# Patient Record
Sex: Male | Born: 1955 | Race: White | Hispanic: No | Smoking: Former smoker
Health system: Southern US, Community
[De-identification: ages and names within clinical notes are randomized; demographics above are authoritative.]

## PROBLEM LIST (undated history)

## (undated) DIAGNOSIS — M1990 Unspecified osteoarthritis, unspecified site: Secondary | ICD-10-CM

## (undated) DIAGNOSIS — K219 Gastro-esophageal reflux disease without esophagitis: Secondary | ICD-10-CM

## (undated) DIAGNOSIS — Z87442 Personal history of urinary calculi: Secondary | ICD-10-CM

## (undated) DIAGNOSIS — C801 Malignant (primary) neoplasm, unspecified: Secondary | ICD-10-CM

## (undated) HISTORY — PX: EXTRACORPOREAL SHOCK WAVE LITHOTRIPSY: SHX1557

---

## 2005-08-12 ENCOUNTER — Ambulatory Visit: Payer: Self-pay | Admitting: Gastroenterology

## 2009-08-04 ENCOUNTER — Emergency Department: Payer: Self-pay | Admitting: Internal Medicine

## 2009-08-05 ENCOUNTER — Emergency Department (HOSPITAL_COMMUNITY): Admission: EM | Admit: 2009-08-05 | Discharge: 2009-08-05 | Payer: Self-pay | Admitting: Family Medicine

## 2009-08-06 ENCOUNTER — Inpatient Hospital Stay (HOSPITAL_COMMUNITY): Admission: EM | Admit: 2009-08-06 | Discharge: 2009-08-13 | Payer: Self-pay | Admitting: Emergency Medicine

## 2009-08-14 ENCOUNTER — Ambulatory Visit: Payer: Self-pay | Admitting: Orthopedic Surgery

## 2009-08-16 ENCOUNTER — Emergency Department (HOSPITAL_COMMUNITY): Admission: EM | Admit: 2009-08-16 | Discharge: 2009-08-17 | Payer: Self-pay | Admitting: Emergency Medicine

## 2009-08-28 ENCOUNTER — Ambulatory Visit: Payer: Self-pay | Admitting: Orthopedic Surgery

## 2010-07-19 ENCOUNTER — Ambulatory Visit: Payer: Self-pay | Admitting: Urology

## 2010-10-07 DIAGNOSIS — C61 Malignant neoplasm of prostate: Secondary | ICD-10-CM

## 2010-10-07 HISTORY — DX: Malignant neoplasm of prostate: C61

## 2010-10-07 HISTORY — PX: INSERTION PROSTATE RADIATION SEED: SUR718

## 2010-12-13 ENCOUNTER — Ambulatory Visit: Payer: Self-pay | Admitting: Urology

## 2011-01-09 LAB — CBC
Hemoglobin: 11.9 g/dL — ABNORMAL LOW (ref 13.0–17.0)
Hemoglobin: 13 g/dL (ref 13.0–17.0)
MCHC: 34.5 g/dL (ref 30.0–36.0)
MCHC: 35.2 g/dL (ref 30.0–36.0)
MCV: 91.8 fL (ref 78.0–100.0)
MCV: 92.2 fL (ref 78.0–100.0)
Platelets: 230 10*3/uL (ref 150–400)
RBC: 4 MIL/uL — ABNORMAL LOW (ref 4.22–5.81)
RBC: 4.04 MIL/uL — ABNORMAL LOW (ref 4.22–5.81)
RDW: 12.3 % (ref 11.5–15.5)
RDW: 12.6 % (ref 11.5–15.5)
RDW: 13.1 % (ref 11.5–15.5)
RDW: 13.1 % (ref 11.5–15.5)
WBC: 10.4 10*3/uL (ref 4.0–10.5)
WBC: 10.8 10*3/uL — ABNORMAL HIGH (ref 4.0–10.5)

## 2011-01-09 LAB — BASIC METABOLIC PANEL
BUN: 10 mg/dL (ref 6–23)
BUN: 6 mg/dL (ref 6–23)
BUN: 8 mg/dL (ref 6–23)
CO2: 25 mEq/L (ref 19–32)
CO2: 25 mEq/L (ref 19–32)
CO2: 27 mEq/L (ref 19–32)
Calcium: 8.2 mg/dL — ABNORMAL LOW (ref 8.4–10.5)
Calcium: 8.3 mg/dL — ABNORMAL LOW (ref 8.4–10.5)
Calcium: 8.7 mg/dL (ref 8.4–10.5)
Calcium: 8.7 mg/dL (ref 8.4–10.5)
Chloride: 104 mEq/L (ref 96–112)
Chloride: 106 mEq/L (ref 96–112)
Chloride: 107 mEq/L (ref 96–112)
Creatinine, Ser: 0.8 mg/dL (ref 0.4–1.5)
GFR calc Af Amer: >60 mL/min (ref >60)
GFR calc Af Amer: >60 mL/min (ref >60)
GFR calc Af Amer: >60 mL/min (ref >60)
GFR calc non Af Amer: >60 mL/min (ref >60)
GFR calc non Af Amer: >60 mL/min (ref >60)
Glucose, Bld: 108 mg/dL — ABNORMAL HIGH (ref 70–99)
Glucose, Bld: 114 mg/dL — ABNORMAL HIGH (ref 70–99)
Potassium: 3.3 mEq/L — ABNORMAL LOW (ref 3.5–5.1)
Sodium: 133 mEq/L — ABNORMAL LOW (ref 135–145)
Sodium: 136 mEq/L (ref 135–145)
Sodium: 140 mEq/L (ref 135–145)
Sodium: 141 mEq/L (ref 135–145)

## 2011-01-09 LAB — ANAEROBIC CULTURE

## 2011-01-09 LAB — POCT I-STAT, CHEM 8
BUN: 18 mg/dL (ref 6–23)
Creatinine, Ser: 0.7 mg/dL (ref 0.4–1.5)
HCT: 38 % — ABNORMAL LOW (ref 39.0–52.0)
Hemoglobin: 12.9 g/dL — ABNORMAL LOW (ref 13.0–17.0)
Sodium: 142 mEq/L (ref 135–145)
TCO2: 27 mmol/L (ref 0–100)

## 2011-01-09 LAB — DIFFERENTIAL
Basophils Relative: 1 % (ref 0–1)
Eosinophils Relative: 5 % (ref 0–5)
Lymphs Abs: 2.1 10*3/uL (ref 0.7–4.0)
Neutro Abs: 7.5 10*3/uL (ref 1.7–7.7)
Neutrophils Relative %: 70 % (ref 43–77)

## 2011-01-09 LAB — HEMOGLOBIN A1C: Mean Plasma Glucose: 111 mg/dL

## 2011-01-09 LAB — BODY FLUID CULTURE

## 2011-01-10 LAB — CBC
HCT: 44 % (ref 39.0–52.0)
Hemoglobin: 13.1 g/dL (ref 13.0–17.0)
MCHC: 34.4 g/dL (ref 30.0–36.0)
MCHC: 34.9 g/dL (ref 30.0–36.0)
MCV: 93.2 fL (ref 78.0–100.0)
RDW: 12.6 % (ref 11.5–15.5)
RDW: 13 % (ref 11.5–15.5)

## 2011-01-10 LAB — CULTURE, BLOOD (ROUTINE X 2)
Culture: NO GROWTH
Culture: NO GROWTH

## 2011-01-10 LAB — URINE MICROSCOPIC-ADD ON

## 2011-01-10 LAB — COMPREHENSIVE METABOLIC PANEL
ALT: 18 U/L (ref 0–53)
Calcium: 8.1 mg/dL — ABNORMAL LOW (ref 8.4–10.5)
Creatinine, Ser: 0.78 mg/dL (ref 0.4–1.5)
GFR calc Af Amer: >60 mL/min (ref >60)
GFR calc non Af Amer: >60 mL/min (ref >60)
Glucose, Bld: 162 mg/dL — ABNORMAL HIGH (ref 70–99)
Sodium: 135 mEq/L (ref 135–145)
Total Protein: 6 g/dL (ref 6.0–8.3)

## 2011-01-10 LAB — DIFFERENTIAL
Eosinophils Absolute: 0 10*3/uL (ref 0.0–0.7)
Eosinophils Relative: 0 % (ref 0–5)
Lymphocytes Relative: 5 % — ABNORMAL LOW (ref 12–46)
Lymphocytes Relative: 7 % — ABNORMAL LOW (ref 12–46)
Lymphs Abs: 0.9 10*3/uL (ref 0.7–4.0)
Monocytes Absolute: 1.2 10*3/uL — ABNORMAL HIGH (ref 0.1–1.0)
Monocytes Relative: 5 % (ref 3–12)
Monocytes Relative: 6 % (ref 3–12)
Neutrophils Relative %: 89 % — ABNORMAL HIGH (ref 43–77)
WBC Morphology: INCREASED

## 2011-01-10 LAB — SEDIMENTATION RATE: Sed Rate: 27 mm/hr — ABNORMAL HIGH (ref 0–16)

## 2011-01-10 LAB — URINALYSIS, ROUTINE W REFLEX MICROSCOPIC
Bilirubin Urine: NEGATIVE
Glucose, UA: >1000 mg/dL — AB
Hgb urine dipstick: NEGATIVE
Ketones, ur: NEGATIVE mg/dL
Protein, ur: NEGATIVE mg/dL

## 2011-01-10 LAB — LACTIC ACID, PLASMA: Lactic Acid, Venous: 1.6 mmol/L (ref 0.5–2.2)

## 2011-01-10 LAB — POCT I-STAT, CHEM 8
Chloride: 104 mEq/L (ref 96–112)
HCT: 46 % (ref 39.0–52.0)
Hemoglobin: 15.6 g/dL (ref 13.0–17.0)
Potassium: 3.4 mEq/L — ABNORMAL LOW (ref 3.5–5.1)

## 2014-08-13 ENCOUNTER — Emergency Department: Payer: Self-pay | Admitting: Internal Medicine

## 2016-02-17 ENCOUNTER — Emergency Department
Admission: EM | Admit: 2016-02-17 | Discharge: 2016-02-17 | Disposition: A | Discharge status: Home / Self Care | Payer: Worker's Compensation | Attending: Emergency Medicine | Admitting: Emergency Medicine

## 2016-02-17 ENCOUNTER — Emergency Department: Payer: Worker's Compensation

## 2016-02-17 ENCOUNTER — Encounter: Payer: Self-pay | Admitting: Emergency Medicine

## 2016-02-17 DIAGNOSIS — W01198A Fall on same level from slipping, tripping and stumbling with subsequent striking against other object, initial encounter: Secondary | ICD-10-CM | POA: Insufficient documentation

## 2016-02-17 DIAGNOSIS — S62102A Fracture of unspecified carpal bone, left wrist, initial encounter for closed fracture: Secondary | ICD-10-CM

## 2016-02-17 DIAGNOSIS — Y9285 Railroad track as the place of occurrence of the external cause: Secondary | ICD-10-CM | POA: Insufficient documentation

## 2016-02-17 DIAGNOSIS — Y9389 Activity, other specified: Secondary | ICD-10-CM | POA: Insufficient documentation

## 2016-02-17 DIAGNOSIS — Y990 Civilian activity done for income or pay: Secondary | ICD-10-CM | POA: Diagnosis not present

## 2016-02-17 DIAGNOSIS — Z87891 Personal history of nicotine dependence: Secondary | ICD-10-CM | POA: Insufficient documentation

## 2016-02-17 DIAGNOSIS — S52592A Other fractures of lower end of left radius, initial encounter for closed fracture: Secondary | ICD-10-CM | POA: Diagnosis not present

## 2016-02-17 DIAGNOSIS — M25532 Pain in left wrist: Secondary | ICD-10-CM | POA: Diagnosis present

## 2016-02-17 MED ORDER — IBUPROFEN 800 MG PO TABS: 800.0000 mg | ORAL_TABLET | Freq: Three times a day (TID) | ORAL | Status: DC | PRN

## 2016-02-17 MED ORDER — TRAMADOL HCL 50 MG PO TABS: 50.0000 mg | ORAL_TABLET | Freq: Four times a day (QID) | ORAL | Status: AC | PRN

## 2016-02-17 MED ORDER — IBUPROFEN 800 MG PO TABS
800.0000 mg | ORAL_TABLET | Freq: Once | ORAL | Status: AC
  Administered 2016-02-17: 800 mg via ORAL
  Filled 2016-02-17: qty 1

## 2016-02-17 MED ORDER — TRAMADOL HCL 50 MG PO TABS
50.0000 mg | ORAL_TABLET | Freq: Once | ORAL | Status: AC
Start: 2016-02-17 — End: 2016-02-17
  Administered 2016-02-17: 50 mg via ORAL
  Filled 2016-02-17: qty 1

## 2016-02-17 NOTE — ED Provider Notes (Signed)
[facility] Emergency Department Provider Note   ____________________________________________  Time seen: Approximately 4:35 PM  I have reviewed the triage vital signs and the nursing notes.   HISTORY  Chief Complaint Wrist Pain    HPI Nathan Holland is a 60 y.o. male chief complaint of left wrist pain secondary to fall. 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 He is rating his pain9/10. Patient describes the pain as "achy". Patient is right-hand dominant. No palliative measures taken prior to arrival.   History reviewed. No pertinent past medical history.  There are no active problems to display for this patient.   History reviewed. No pertinent past surgical history.  Current Outpatient Rx  Name  Route  Sig  Dispense  Refill  . ibuprofen (ADVIL,MOTRIN) 800 MG tablet   Oral   Take 1 tablet (800 mg total) by mouth every 8 (eight) hours as needed.   30 tablet   0   . traMADol (ULTRAM) 50 MG tablet   Oral   Take 1 tablet (50 mg total) by mouth every 6 (six) hours as needed.   20 tablet   0     Allergies Review of patient's allergies indicates no known allergies.  No family history on file.  Social History Social History  Substance Use Topics  . Smoking status: Former Research scientist (life sciences)  . Smokeless tobacco: None  . Alcohol Use: No    Review of Systems Constitutional: No fever/chills Eyes: No visual changes. ENT: No sore throat. Cardiovascular: Denies chest pain. Respiratory: Denies shortness of breath. Gastrointestinal: No abdominal pain.  No nausea, no vomiting.  No diarrhea.  No constipation. Genitourinary: Negative for dysuria. Musculoskeletal: Left wrist pain Skin: Negative for rash. Neurological: Negative for headaches, focal weakness or numbness.    ____________________________________________   PHYSICAL EXAM:  VITAL SIGNS: ED Triage Vitals  Enc Vitals Group   BP 02/17/16 1618 169/85 mmHg     Pulse Rate 02/17/16 1618 57     Resp 02/17/16 1618 18     Temp 02/17/16 1618 97.7 F (36.5 C)     Temp Source 02/17/16 1618 Oral     SpO2 02/17/16 1618 97 %     Weight 02/17/16 1618 275 lb (124.739 kg)     Height 02/17/16 1618 5\' 11"  (1.803 m)     Head Cir --      Peak Flow --      Pain Score 02/17/16 1617 9     Pain Loc --      Pain Edu? --      Excl. in [place]? --     Constitutional: Alert and oriented. Well appearing and in no acute distress. Eyes: Conjunctivae are normal. PERRL. EOMI. Head: Atraumatic. Nose: No congestion/rhinnorhea. Mouth/Throat: Mucous membranes are moist.  Oropharynx non-erythematous. Neck: No stridor.  No cervical spine tenderness to palpation. Hematological/Lymphatic/Immunilogical: No cervical lymphadenopathy. Cardiovascular: Normal rate, regular rhythm. Grossly normal heart sounds.  Good peripheral circulation. Respiratory: Normal respiratory effort.  No retractions. Lungs CTAB. Gastrointestinal: Soft and nontender. No distention. No abdominal bruits. No CVA tenderness. Musculoskeletal: No obvious deformity to the left wrist. Moderate edema. Neurovascular intact. Patient guarding palpation of the distal radius.  Neurologic:  Normal speech and language. No gross focal neurologic deficits are appreciated. No gait instability. Skin:  Skin is warm, dry and intact. No rash noted. Psychiatric: Mood and affect are normal. Speech and behavior are normal.  ____________________________________________  LABS (all labs ordered are listed, but only abnormal results are displayed)  Labs Reviewed - No data to display ____________________________________________  EKG   ____________________________________________  RADIOLOGY  Distal left radial fracture. I, Sable Feil, personally viewed and evaluated these images (plain radiographs) as part of my medical decision making, as well as reviewing the written report by the  radiologist.  ____________________________________________   PROCEDURES  Procedure(s) performed: None  Critical Care performed: No  ____________________________________________   INITIAL IMPRESSION / ASSESSMENT AND PLAN / ED COURSE  Pertinent labs & imaging results that were available during my care of the patient were reviewed by me and considered in my medical decision making (see chart for details).  Wrist fracture. Discussed x-ray finding with patient. Patient placed in a sugar tong splint with sling. Patient given discharge care instructions. Patient advised to follow orthopedics by calling for [facility] in 2 days. Patient given a prescription for ibuprofen and tramadol. ____________________________________________   FINAL CLINICAL IMPRESSION(S) / ED DIAGNOSES  Final diagnoses:  Left wrist fracture, closed, initial encounter      NEW MEDICATIONS STARTED DURING THIS VISIT:  New Prescriptions   IBUPROFEN (ADVIL,MOTRIN) 800 MG TABLET    Take 1 tablet (800 mg total) by mouth every 8 (eight) hours as needed.   TRAMADOL (ULTRAM) 50 MG TABLET    Take 1 tablet (50 mg total) by mouth every 6 (six) hours as needed.     Note:  This document was prepared using Dragon voice recognition software and may include unintentional dictation errors.    Sable Feil, PA-C 02/17/16 1649  Daymon Larsen, MD 02/17/16 [phone]

## 2016-02-17 NOTE — ED Notes (Signed)
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

## 2016-02-17 NOTE — Discharge Instructions (Signed)
Wear Wrist Fracture A wrist fracture is a break or crack in one of the bones of your wrist. Your wrist is made up of eight small bones at the palm of your hand (carpal bones) and two long bones that make up your forearm (radius and ulna). CAUSES  A direct blow to the wrist.  Falling on an outstretched hand.  Trauma, such as a car accident or a fall. RISK FACTORS Risk factors for wrist fracture include:  Participating in contact and high-risk sports, such as skiing, biking, and ice skating.  Taking steroid medicines.  Smoking.  Being male.  Being Caucasian.  Drinking more than three alcoholic beverages per day.  Having low or lowered bone density (osteoporosis or osteopenia).  Age. Older adults have decreased bone density.  Women who have had menopause.  History of previous fractures. SIGNS AND SYMPTOMS Symptoms of wrist fractures include tenderness, bruising, and inflammation. Additionally, the wrist may hang in an odd position or appear deformed. DIAGNOSIS Diagnosis may include:  Physical exam.  X-ray. TREATMENT Treatment depends on many factors, including the nature and location of the fracture, your age, and your activity level. Treatment for wrist fracture can be nonsurgical or surgical. Nonsurgical Treatment A plaster cast or splint may be applied to your wrist if the bone is in a good position. If the fracture is not in good position, it may be necessary for your health care provider to realign it before applying a splint or cast. Usually, a cast or splint will be worn for several weeks. Surgical Treatment Sometimes the position of the bone is so far out of place that surgery is required to apply a device to hold it together as it heals. Depending on the fracture, there are a number of options for holding the bone in place while it heals, such as a cast and metal pins. HOME CARE INSTRUCTIONS  Keep your injured wrist elevated and move your fingers as much as  possible.  Do not put pressure on any part of your cast or splint. It may break.  Use a plastic bag to protect your cast or splint from water while bathing or showering. Do not lower your cast or splint into water.  Take medicines only as directed by your health care provider.  Keep your cast or splint clean and dry. If it becomes wet, damaged, or suddenly feels too tight, contact your health care provider right away.  Do not use any tobacco products including cigarettes, chewing tobacco, or electronic cigarettes. Tobacco can delay bone healing. If you need help quitting, ask your health care provider.  Keep all follow-up visits as directed by your health care provider. This is important.  Ask your health care provider if you should take supplements of calcium and vitamins C and D to promote bone healing. SEEK MEDICAL CARE IF:  Your cast or splint is damaged, breaks, or gets wet.  You have a fever.  You have chills.  You have continued severe pain or more swelling than you did before the cast was put on. SEEK IMMEDIATE MEDICAL CARE IF:  Your hand or fingernails on the injured arm turn blue or gray, or feel cold or numb.  You have decreased feeling in the fingers of your injured arm. MAKE SURE YOU:  Understand these instructions.  Will watch your condition.  Will get help right away if you are not doing well or get worse.   This information is not intended to replace advice given to you by  your health care provider. Make sure you discuss any questions you have with your health care provider.   Document Released: 07/03/2005 Document Revised: 06/14/2015 Document Reviewed: 10/11/2011 Elsevier Interactive Patient Education 2016 [employer] t splint and sling until evaluation by orthopedics doctor.

## 2016-02-17 NOTE — ED Notes (Signed)
Pt verbalized understanding of discharge instructions. NAD at this time. 

## 2016-07-02 ENCOUNTER — Ambulatory Visit: Payer: Worker's Compensation | Attending: Orthopedic Surgery | Admitting: Occupational Therapy

## 2016-07-02 DIAGNOSIS — M6281 Muscle weakness (generalized): Secondary | ICD-10-CM | POA: Diagnosis present

## 2016-07-02 DIAGNOSIS — M25532 Pain in left wrist: Secondary | ICD-10-CM

## 2016-07-02 DIAGNOSIS — M25632 Stiffness of left wrist, not elsewhere classified: Secondary | ICD-10-CM

## 2016-07-02 NOTE — Therapy (Signed)
[place] [facility] [address], [place], [address] Phone: [phone]   Fax:  [phone]  Occupational Therapy Treatment  Patient Details  Name: Nathan Holland MRN: [id number] Date of Birth: [birth year] Referring Provider: Mack Guise  Encounter Date: 07/02/2016      OT End of Session - 07/02/16 0931    Visit Number 1   Number of Visits 12   Date for OT Re-Evaluation 08/13/16   OT Start Time 0820   OT Stop Time 0918   OT Time Calculation (min) 58 min   Activity Tolerance Patient tolerated treatment well   Behavior During Therapy [facility] for tasks assessed/performed      No past medical history on file.  No past surgical history on file.  There were no vitals filed for this visit.      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 Yes   Pain Score 1    Pain Location Wrist   Pain Orientation Left   Pain Descriptors / Indicators Aching            [facility] OT Assessment - 07/02/16 0001      Assessment   Diagnosis L colles radius fx   Referring Provider Mack Guise   Onset Date 02/17/16     Balance Screen   Has the patient fallen in the past 6 months Yes   How many times? 1   Has the patient had a decrease in activity level because of a fear of falling?  No   Is the patient reluctant to leave their home because of a fear of falling?  No     Home  Environment   Lives With Spouse     Prior Function   Vocation Full time employment   Leisure Work as Marketing executive , trucks)  ;  fish, yard work ,     AROM   Right Wrist Extension 67 Degrees   Right Wrist Flexion 80 Degrees   Right Wrist Radial Deviation 23 Degrees   Right Wrist Ulnar Deviation 24 Degrees   Left Wrist Extension 58 Degrees   Left Wrist Flexion 44 Degrees   Left Wrist Radial Deviation 16 Degrees   Left Wrist Ulnar Deviation 28 Degrees     Strength   Right Hand Grip (lbs) 81   Right Hand Lateral Pinch 20 lbs   Right Hand 3 Point Pinch 17 lbs   Left Hand Grip (lbs) 20   Left Hand Lateral Pinch 16 lbs   Left Hand 3 Point Pinch 12 lbs     Left Hand AROM   L Thumb Opposition to Index --  Opposition to base of 5th    L Index  MCP 0-90 80 Degrees   L Index PIP 0-100 85 Degrees   L Long  MCP 0-90 80 Degrees   L Long PIP 0-100 92 Degrees   L Ring  MCP 0-90 80 Degrees  L Ring PIP 0-100 95 Degrees   L Little  MCP 0-90 85 Degrees   L Little PIP 0-100 95 Degrees       Fluido done with AROM of wrist in all planes - to increase ROM and decrease pain  Review HEP  PROM for wrist ext, flexion , RD AROM for wrist in all planes  Tendon glides  10 reps all  2 x day                     OT Education - 07/02/16 0930    Education provided Yes   Education Details findings reviewed and HEP    Person(s) Educated Patient   Methods Explanation;Demonstration;Tactile cues;Verbal cues;Handout   Comprehension Verbal cues required;Returned demonstration;Verbalized understanding          OT Short Term Goals - 07/02/16 0937      OT SHORT TERM GOAL #1   Title Pain on PRWHE improve by at least 15 points    Baseline Pain on PRWHE 32/50 at eval   Time 3   Period Weeks   Status New     OT SHORT TERM GOAL #2   Title AROM for wrist in flexion improve by 20 degrees, and flexion/RD 5 degrees to tuck shirt in    Baseline AROm flexion 44R, L 80 R ; ext 58 L , R 67    Time 3   Period Weeks   Status New     OT SHORT TERM GOAL #3   Title Pt to be ind in HEP to decrease pain , increase ROM  and strength in L hand to increase function on PRWHE by 15 points    Baseline PRWHE function score 23/50 at eval    Time 4   Period Weeks   Status New           OT Long Term Goals - 07/02/16 0940      OT LONG TERM GOAL #1   Title R grip strength improve by 10-15 lbs to carry 10 lbs or lift chair without pain, and turn tools at work    Baseline Grip 20 L , R 81 lbs    Time 6   Period Weeks   Status New     OT LONG TERM GOAL #2   Title L3 point grip improve with 3 lbs to turn bolts   Baseline 3 point grip L 12, R 17 lbs    Time 6   Period Weeks   Status New               Plan - 07/02/16 0932    Clinical Impression Statement Pt present about 4 1/2 wks out from L distal radius fx ( Colles) - pt show increase pain in night more than daytime - show decrease AROM in all planes at wrist with flexion the worse , decrease grip and prehension strength - limiting his use of L hand  in functional tasks    Rehab Potential Good   OT Frequency 2x / week   OT Duration 6 weeks   OT Treatment/Interventions Self-care/ADL training;Fluidtherapy;Patient/family education;Therapeutic exercises;Passive range of motion;Manual Therapy   Plan Check on HEP - pain    OT Home Exercise Plan see pt instruction   Consulted and Agree with Plan of Care Patient      Patient will benefit from skilled therapeutic intervention in order to improve the following deficits and impairments:  Decreased range of motion, Impaired flexibility, Increased edema,  Impaired UE functional use, Pain, Decreased strength  Visit Diagnosis: Pain in left wrist - Plan: Ot plan of care cert/re-cert  Stiffness of left wrist, not elsewhere classified - Plan: Ot plan of care cert/re-cert  Muscle weakness (generalized) - Plan: Ot plan of care cert/re-cert    Problem List There are no active problems to display for this patient.   Rosalyn Gess OTR/L,CLT 07/02/2016, 9:47 AM  [place] [facility] [address] [place], [address] Phone: [phone]   Fax:  [phone]  Name: Nathan Holland MRN: [id number] Date of Birth: March 14, 1956

## 2016-07-02 NOTE — Patient Instructions (Signed)
Heat  PROM for wrist ext, flexion , RD AROM for wrist in all planes  Tendon glides  10 reps all  2 x day

## 2016-07-05 ENCOUNTER — Ambulatory Visit: Payer: Worker's Compensation | Admitting: Occupational Therapy

## 2016-07-05 DIAGNOSIS — M25532 Pain in left wrist: Secondary | ICD-10-CM | POA: Diagnosis not present

## 2016-07-05 DIAGNOSIS — M25632 Stiffness of left wrist, not elsewhere classified: Secondary | ICD-10-CM

## 2016-07-05 DIAGNOSIS — M6281 Muscle weakness (generalized): Secondary | ICD-10-CM

## 2016-07-05 NOTE — Therapy (Signed)
[place] [facility] [address], [place], [address] Phone: [phone]   Fax:  [phone]  Occupational Therapy Treatment  Patient Details  Name: Nathan Holland MRN: [id number] Date of Birth: 08-06-1956 Referring Provider: Mack Guise  Encounter Date: 07/05/2016      OT End of Session - 07/05/16 0915    Visit Number 2   Number of Visits 12   Date for OT Re-Evaluation 08/13/16   OT Start Time 0815   OT Stop Time 0900   OT Time Calculation (min) 45 min   Activity Tolerance Patient tolerated treatment well   Behavior During Therapy [facility] for tasks assessed/performed      No past medical history on file.  No past surgical history on file.  There were no vitals filed for this visit.      Subjective Assessment - 07/05/16 0819    Subjective  It feels like I cannot make tight fist- when I try - feel pull over the back of my hand when I do that -   Patient Stated Goals Want to get the pain better , my grip stronger, and more motion that I can use the tools, lift objects at work , fish , turn like bolts , tools    Currently in Pain? Yes   Pain Score 1    Pain Location Wrist   Pain Orientation Left   Pain Descriptors / Indicators Aching            [facility] OT Assessment - 07/05/16 0001      AROM   Left Wrist Extension 70 Degrees  after heat and CPM   Left Wrist Flexion 52 Degrees  after heat and CPM                  OT Treatments/Exercises (OP) - 07/05/16 0001      LUE Paraffin   Number Minutes Paraffin 10 Minutes   LUE Paraffin Location Hand;Wrist   Comments at [facility] to increaes ROM and decrease pain       After parafin   Soft tissue mobs to volar and dorsal wrist and forearm prior to ROM - to increase motion  In flexion and extention   PROM over edge of table for wrist flexion and extention , RD and UD  12 reps each  BTE for CPM for flexion and extention 200 sec each  BTE flexion  18 lbs 120 sec  Wrist extention 2 lbs 12 reps Showed increase flexion and extention - see flowsheet   16 hammer for HEP reviewed and add  Sup/pro RD and UD  Wrist flexion and extention  12 reps each   Green putty for grip and 3 point  12 reps  No increase pain  Add to HEP              OT Education - 07/05/16 0915    Education provided Yes   Education Details HEP updated   Person(s) Educated Patient   Methods Explanation;Demonstration;Tactile cues;Verbal cues;Handout   Comprehension Verbal cues required;Returned demonstration;Verbalized understanding          OT Short Term Goals - 07/02/16 0937      OT SHORT TERM GOAL #1   Title Pain on PRWHE improve by at least 15 points    Baseline Pain on PRWHE 32/50 at eval   Time 3   Period Weeks   Status New     OT SHORT TERM GOAL #2  Title AROM for wrist in flexion improve by 20 degrees, and flexion/RD 5 degrees to tuck shirt in    Baseline AROm flexion 44R, L 80 R ; ext 58 L , R 67    Time 3   Period Weeks   Status New     OT SHORT TERM GOAL #3   Title Pt to be ind in HEP to decrease pain , increase ROM and strength in L hand to increase function on PRWHE by 15 points    Baseline PRWHE function score 23/50 at eval    Time 4   Period Weeks   Status New           OT Long Term Goals - 07/02/16 0940      OT LONG TERM GOAL #1   Title R grip strength improve by 10-15 lbs to carry 10 lbs or lift chair without pain, and turn tools at work    Baseline Grip 20 L , R 81 lbs    Time 6   Period Weeks   Status New     OT LONG TERM GOAL #2   Title L3 point grip improve with 3 lbs to turn bolts   Baseline 3 point grip L 12, R 17 lbs    Time 6   Period Weeks   Status New               Plan - 07/05/16 0915    Clinical Impression Statement Pt is 4 1/2 months out from R distal radius fx ( Colles) - wrote wrong on eval weeks - pt was fitted with Benik neoprene wrist splint for work for support while  increasing his motion and strength in end range -  showed great progress at wrist in session    Rehab Potential Good   OT Frequency 2x / week   OT Duration 6 weeks   OT Treatment/Interventions Self-care/ADL training;Fluidtherapy;Patient/family education;Therapeutic exercises;Passive range of motion;Manual Therapy   OT Home Exercise Plan see pt instruction   Consulted and Agree with Plan of Care Patient      Patient will benefit from skilled therapeutic intervention in order to improve the following deficits and impairments:  Decreased range of motion, Impaired flexibility, Increased edema, Impaired UE functional use, Pain, Decreased strength  Visit Diagnosis: Pain in left wrist  Stiffness of left wrist, not elsewhere classified  Muscle weakness (generalized)    Problem List There are no active problems to display for this patient.   Rosalyn Gess OTR/L,CLT 07/05/2016, 10:59 AM  [place] [facility] [address], [place], [address] Phone: [phone]   Fax:  [phone]  Name: Nathan Holland MRN: [id number] Date of Birth: 03/25/1956

## 2016-07-05 NOTE — Patient Instructions (Addendum)
Same HEP but add  16 hammer Sup/pro RD and UD  Wrist flexion and extention  12 reps each   Green putty for grip and 3 point  12 reps  No increase pain

## 2016-07-09 ENCOUNTER — Ambulatory Visit: Payer: Worker's Compensation | Attending: Orthopedic Surgery | Admitting: Occupational Therapy

## 2016-07-09 DIAGNOSIS — M25632 Stiffness of left wrist, not elsewhere classified: Secondary | ICD-10-CM

## 2016-07-09 DIAGNOSIS — M6281 Muscle weakness (generalized): Secondary | ICD-10-CM | POA: Insufficient documentation

## 2016-07-09 DIAGNOSIS — M25532 Pain in left wrist: Secondary | ICD-10-CM | POA: Diagnosis not present

## 2016-07-09 NOTE — Therapy (Signed)
[facility] [address] [place], [address] Phone: [phone]   Fax:  [phone]  Occupational Therapy Treatment  Patient Details  Name: Nathan Holland MRN: [id number] Date of Birth: 1956/08/23 Referring Provider: Mack Guise  Encounter Date: 07/09/2016      OT End of Session - 07/09/16 0831    Visit Number 3   Number of Visits 12   Date for OT Re-Evaluation 08/13/16   OT Start Time 0815   OT Stop Time 0853   OT Time Calculation (min) 38 min   Activity Tolerance Patient tolerated treatment well   Behavior During Therapy [facility] for tasks assessed/performed      No past medical history on file.  No past surgical history on file.  There were no vitals filed for this visit.      Subjective Assessment - 07/09/16 0828    Subjective  I woked up this am about 2 and my wrist just hurting - pain about 6/10 - was sore after last time - but better by Sat and SUn    Patient Stated Goals Want to get the pain better , my grip stronger, and more motion that I can use the tools, lift objects at work , fish , turn like bolts , tools    Currently in Pain? Yes   Pain Score 5    Pain Location Wrist   Pain Orientation Left   Pain Descriptors / Indicators Aching            [facility] OT Assessment - 07/09/16 0001      AROM   Left Wrist Extension 70 Degrees   Left Wrist Flexion 50 Degrees     Strength   Left Hand Grip (lbs) 30                  OT Treatments/Exercises (OP) - 07/09/16 0001      LUE Paraffin   Number Minutes Paraffin 10 Minutes   LUE Paraffin Location Hand;Wrist   Comments at [facility] to decrease  pain and increase ROM       Measured flexion , ext AROM - see flowsheet After parafin pain decrease to 1/10  Soft tissue mobs to volar and dorsal wrist and forearm prior to ROM - to increase motion  In flexion and extention  - used Graston tools nr 2 and 4 some brushing and sweeping -    PROM  By OT for supination and Place and hold  PROM for wrist extention and flexion by OT in pain free range 12 reps each  BTE for CPM for flexion 200 sec each  Manual resistance in all planes - no pain - only with PROM end range flexion and extention of wrist No pain with traction or joint mobs to wrist   Attempted Green putty for grip  But pain in wrist - pt to hold off on putty and do only heat , massage , wrist flexion stretch and ice until next appt               OT Education - 07/09/16 0831    Education provided Yes   Education Details HEP update   Person(s) Educated Patient   Methods Explanation;Demonstration;Tactile cues;Verbal cues   Comprehension Returned demonstration;Verbalized understanding          OT Short Term Goals - 07/02/16 0937      OT SHORT TERM GOAL #1   Title Pain on PRWHE improve by at  least 15 points    Baseline Pain on PRWHE 32/50 at eval   Time 3   Period Weeks   Status New     OT SHORT TERM GOAL #2   Title AROM for wrist in flexion improve by 20 degrees, and flexion/RD 5 degrees to tuck shirt in    Baseline AROm flexion 44R, L 80 R ; ext 58 L , R 67    Time 3   Period Weeks   Status New     OT SHORT TERM GOAL #3   Title Pt to be ind in HEP to decrease pain , increase ROM and strength in L hand to increase function on PRWHE by 15 points    Baseline PRWHE function score 23/50 at eval    Time 4   Period Weeks   Status New           OT Long Term Goals - 07/02/16 0940      OT LONG TERM GOAL #1   Title R grip strength improve by 10-15 lbs to carry 10 lbs or lift chair without pain, and turn tools at work    Baseline Grip 20 L , R 81 lbs    Time 6   Period Weeks   Status New     OT LONG TERM GOAL #2   Title L3 point grip improve with 3 lbs to turn bolts   Baseline 3 point grip L 12, R 17 lbs    Time 6   Period Weeks   Status New               Plan - 07/09/16 [id number]    Clinical Impression Statement Pt shows  great progress in wrist AROM and grip improved with 10 lbs - but has increase pain since 2am - pt did clean workshop yesterday - and don't know if slept wrong - pt also working during day in further range that he had for weeks - pt to hold off on HEP except  PROM flexion and use heat /ice as needed    Rehab Potential Good   OT Frequency 2x / week   OT Treatment/Interventions Self-care/ADL training;Fluidtherapy;Patient/family education;Therapeutic exercises;Passive range of motion;Manual Therapy   OT Home Exercise Plan see pt instruction   Consulted and Agree with Plan of Care Patient      Patient will benefit from skilled therapeutic intervention in order to improve the following deficits and impairments:  Decreased range of motion, Impaired flexibility, Increased edema, Impaired UE functional use, Pain, Decreased strength  Visit Diagnosis: Pain in left wrist  Stiffness of left wrist, not elsewhere classified  Muscle weakness (generalized)    Problem List There are no active problems to display for this patient.   Rosalyn Gess OTR/L,CLT  07/09/2016, 9:02 AM  [facility] [address] [place], [address] Phone: [phone]   Fax:  [phone]  Name: ERMIL SAXMAN MRN: [id number] Date of Birth: [birth year]

## 2016-07-09 NOTE — Patient Instructions (Addendum)
HEP  pt to hold off on putty and do only heat , massage , wrist flexion stretch and ice until next appt

## 2016-07-11 ENCOUNTER — Ambulatory Visit: Payer: Worker's Compensation | Admitting: Occupational Therapy

## 2016-07-11 DIAGNOSIS — M25632 Stiffness of left wrist, not elsewhere classified: Secondary | ICD-10-CM

## 2016-07-11 DIAGNOSIS — M6281 Muscle weakness (generalized): Secondary | ICD-10-CM

## 2016-07-11 DIAGNOSIS — M25532 Pain in left wrist: Secondary | ICD-10-CM

## 2016-07-11 NOTE — Therapy (Signed)
[facility] [address], [place], [address] Phone: [phone]   Fax:  [phone]  Occupational Therapy Treatment  Patient Details  Name: Nathan Holland MRN: [id number] Date of Birth: 11-30-1955 Referring Provider: Mack Guise  Encounter Date: 07/11/2016      OT End of Session - 07/11/16 0838    Visit Number 4   Number of Visits 12   Date for OT Re-Evaluation 08/13/16   OT Start Time 0815   OT Stop Time 0855   OT Time Calculation (min) 40 min   Activity Tolerance Patient tolerated treatment well   Behavior During Therapy [facility] for tasks assessed/performed      No past medical history on file.  No past surgical history on file.  There were no vitals filed for this visit.      Subjective Assessment - 07/11/16 0829    Subjective  Wrist moving better - less pain since last time - did not feel pain after work    Patient Stated Goals Want to get the pain better , my grip stronger, and more motion that I can use the tools, lift objects at work , fish , turn like bolts , tools    Currently in Pain? Yes   Pain Score 1    Pain Location Wrist   Pain Orientation Left   Pain Descriptors / Indicators Aching            [facility] OT Assessment - 07/11/16 0001      Strength   Left Hand Grip (lbs) 39                  OT Treatments/Exercises (OP) - 07/11/16 0001      Moist Heat Therapy   Number Minutes Moist Heat 10 Minutes   Moist Heat Location Wrist      heating pad on L wrist - 2nd 5 min in flexion stretch    BTE for CPM for flexion  200 sec  BTE flexion 25 lbs 120 sec  Gripper 40 lbs 120 sec  Min v/c and t/c for correct placement  302 for UD and RD - 3 lbs - 100 sec each     Green putty for grip and pulling with all digits 12 reps  No increase pain   Cont for HEP on PROM for flexion, ext, RD and UD ,sup putty            OT Education - 07/11/16 [id number]    Education  provided Yes   Education Details HEp Update    Person(s) Educated Patient   Methods Explanation;Demonstration;Tactile cues;Verbal cues   Comprehension Returned demonstration;Verbalized understanding;Verbal cues required          OT Short Term Goals - 07/02/16 0937      OT SHORT TERM GOAL #1   Title Pain on PRWHE improve by at least 15 points    Baseline Pain on PRWHE 32/50 at eval   Time 3   Period Weeks   Status New     OT SHORT TERM GOAL #2   Title AROM for wrist in flexion improve by 20 degrees, and flexion/RD 5 degrees to tuck shirt in    Baseline AROm flexion 44R, L 80 R ; ext 58 L , R 67    Time 3   Period Weeks   Status New     OT SHORT TERM GOAL #3   Title Pt to be ind in HEP to  decrease pain , increase ROM and strength in L hand to increase function on PRWHE by 15 points    Baseline PRWHE function score 23/50 at eval    Time 4   Period Weeks   Status New           OT Long Term Goals - 07/02/16 0940      OT LONG TERM GOAL #1   Title R grip strength improve by 10-15 lbs to carry 10 lbs or lift chair without pain, and turn tools at work    Baseline Grip 20 L , R 81 lbs    Time 6   Period Weeks   Status New     OT LONG TERM GOAL #2   Title L3 point grip improve with 3 lbs to turn bolts   Baseline 3 point grip L 12, R 17 lbs    Time 6   Period Weeks   Status New               Plan - 07/11/16 [id number]    Clinical Impression Statement Pt making progress in ROM at wrist , grip improved - and pain better than last time - pt to cont with PROM in all planes and putty for grip - cont to keep pain down   Rehab Potential Good   OT Frequency 2x / week   OT Duration 4 weeks   OT Treatment/Interventions Self-care/ADL training;Fluidtherapy;Patient/family education;Therapeutic exercises;Passive range of motion;Manual Therapy   Plan check on HEP and pain ?   OT Home Exercise Plan see pt instruction   Consulted and Agree with Plan of Care Patient       Patient will benefit from skilled therapeutic intervention in order to improve the following deficits and impairments:  Decreased range of motion, Impaired flexibility, Increased edema, Impaired UE functional use, Pain, Decreased strength  Visit Diagnosis: Pain in left wrist  Stiffness of left wrist, not elsewhere classified  Muscle weakness (generalized)    Problem List There are no active problems to display for this patient.   Rosalyn Gess OTR/lLCLT  07/11/2016, 8:58 AM  [facility] [address] [place], [address] Phone: [phone]   Fax:  [phone]  Name: Nathan Holland MRN: [id number] Date of Birth: 09-05-1956

## 2016-07-11 NOTE — Patient Instructions (Signed)
   Cont for HEP on PROM for flexion, ext, RD and UD ,sup putty

## 2016-07-17 ENCOUNTER — Ambulatory Visit: Payer: Worker's Compensation | Admitting: Occupational Therapy

## 2016-07-17 DIAGNOSIS — M25532 Pain in left wrist: Secondary | ICD-10-CM | POA: Diagnosis not present

## 2016-07-17 DIAGNOSIS — M6281 Muscle weakness (generalized): Secondary | ICD-10-CM

## 2016-07-17 DIAGNOSIS — M25632 Stiffness of left wrist, not elsewhere classified: Secondary | ICD-10-CM

## 2016-07-17 NOTE — Patient Instructions (Signed)
Pt to do 2 x day  Contrast on L wrist and hand Med N glide 5 reps   Fitted with wrist splint for sleeping and then use Benik at work -  Ed on not sleeping on hand  And avoid tight grip or lateral grip with wrist flexion

## 2016-07-17 NOTE — Therapy (Signed)
[facility] [address] [place], [address] Phone: [phone]   Fax:  [phone]  Occupational Therapy Treatment  Patient Details  Name: Nathan Holland MRN: [id number] Date of Birth: 07/01/1956 Referring Provider: Mack Guise  Encounter Date: 07/17/2016      OT End of Session - 07/17/16 1145    Visit Number 5   Number of Visits 12   Date for OT Re-Evaluation 08/13/16   OT Start Time 0812   OT Stop Time 0900   OT Time Calculation (min) 48 min   Activity Tolerance Patient tolerated treatment well   Behavior During Therapy [facility] for tasks assessed/performed      No past medical history on file.  No past surgical history on file.  There were no vitals filed for this visit.      Subjective Assessment - 07/17/16 1141    Subjective  I have some numbness in my index and thumb - that I notice - I do sleep on my arm I know- and worked Saturday until about 3    Patient Stated Goals Want to get the pain better , my grip stronger, and more motion that I can use the tools, lift objects at work , fish , turn like bolts , tools    Currently in Pain? Yes   Pain Score 1    Pain Location Finger (Comment which one)   Pain Orientation Left   Pain Descriptors / Indicators Numbness             Wrist measurements and grip - see flowsheet          OT Treatments/Exercises (OP) - 07/17/16 0001      LUE Contrast Bath   Time 11 minutes   Comments Contast done to hand and wrist L at [facility] to decreaese numbness      Graston tools on volar forearm to hand - tool nr 2 and 4 sweeping and brushing to decrease numbness in thumb and 2nd digits Pt to hold off on PROM for flexion , and ed on modifications at work - avoid grip or lat grip with wrist flexion  See home program for wearing of splint - ed on  Med N glide done- 5 reps - and pt to do at home - after doing contrast 2 x day   [place] at 20%, 3.3MHZ  at 1.0  intensity  Over CT at end of session - 4 min to decrease numbness            OT Education - 07/17/16 1145    Education provided Yes   Education Details HEP changes   Person(s) Educated Patient   Methods Explanation;Demonstration;Tactile cues;Verbal cues;Handout   Comprehension Verbal cues required;Returned demonstration;Verbalized understanding          OT Short Term Goals - 07/02/16 0937      OT SHORT TERM GOAL #1   Title Pain on PRWHE improve by at least 15 points    Baseline Pain on PRWHE 32/50 at eval   Time 3   Period Weeks   Status New     OT SHORT TERM GOAL #2   Title AROM for wrist in flexion improve by 20 degrees, and flexion/RD 5 degrees to tuck shirt in    Baseline AROm flexion 44R, L 80 R ; ext 58 L , R 67    Time 3   Period Weeks   Status New     OT SHORT  TERM GOAL #3   Title Pt to be ind in HEP to decrease pain , increase ROM and strength in L hand to increase function on PRWHE by 15 points    Baseline PRWHE function score 23/50 at eval    Time 4   Period Weeks   Status New           OT Long Term Goals - 07/02/16 0940      OT LONG TERM GOAL #1   Title R grip strength improve by 10-15 lbs to carry 10 lbs or lift chair without pain, and turn tools at work    Baseline Grip 20 L , R 81 lbs    Time 6   Period Weeks   Status New     OT LONG TERM GOAL #2   Title L3 point grip improve with 3 lbs to turn bolts   Baseline 3 point grip L 12, R 17 lbs    Time 6   Period Weeks   Status New               Plan - 07/17/16 1145    Clinical Impression Statement Pt arrive this date with reports of some numbness in thumb and index finger - pt did show increase AROM at wrist since SOC and grip - could be pt using that range more at work gripping and twisitng -  pt did not wear Benik splint at work since last visit and report his does sleep on his arm /hand - pt  was also focusing more on wrist flexion since last visit in HEP - changed pt HEP - and  fitted with splint for night time - and ed on activities to avoid    Rehab Potential Good   OT Frequency 2x / week   OT Duration 4 weeks   OT Treatment/Interventions Self-care/ADL training;Fluidtherapy;Patient/family education;Therapeutic exercises;Passive range of motion;Manual Therapy   Plan check on CT symptoms?   OT Home Exercise Plan see pt instruction   Consulted and Agree with Plan of Care Patient      Patient will benefit from skilled therapeutic intervention in order to improve the following deficits and impairments:  Decreased range of motion, Impaired flexibility, Increased edema, Impaired UE functional use, Pain, Decreased strength  Visit Diagnosis: Pain in left wrist  Stiffness of left wrist, not elsewhere classified  Muscle weakness (generalized)    Problem List There are no active problems to display for this patient.   Rosalyn Gess OTR/L,CLT 07/17/2016, 11:50 AM  [place] [facility] [address], [place], [address] Phone: [phone]   Fax:  [phone]  Name: Nathan Holland MRN: [id number] Date of Birth: 09-15-1956

## 2016-07-23 ENCOUNTER — Ambulatory Visit: Payer: Worker's Compensation | Attending: Orthopedic Surgery | Admitting: Occupational Therapy

## 2016-07-23 DIAGNOSIS — M25532 Pain in left wrist: Secondary | ICD-10-CM

## 2016-07-23 DIAGNOSIS — M25632 Stiffness of left wrist, not elsewhere classified: Secondary | ICD-10-CM

## 2016-07-23 DIAGNOSIS — M6281 Muscle weakness (generalized): Secondary | ICD-10-CM

## 2016-07-23 NOTE — Patient Instructions (Addendum)
Pt to add 1/2 dark blue to his green - for increase resistance to what he can handle  pt to not over do and have increase pain Only at home PROM for wrist flexion open hand 10 reps  2 x day  And putty 2 x 15 reps  But should not increase pain or numbness

## 2016-07-23 NOTE — Therapy (Signed)
[facility] [address] [place], [address] Phone: [phone]   Fax:  [phone]  Occupational Therapy Treatment  Patient Details  Name: Nathan [facility] MRN: [id number] Date of Birth: Jun 20, 1956 Referring Provider: Mack Guise  Encounter Date: 07/23/2016      OT End of Session - 07/23/16 1812    Visit Number 6   Number of Visits 12   Date for OT Re-Evaluation 08/13/16   OT Start Time 0808   OT Stop Time 0846   OT Time Calculation (min) 38 min   Activity Tolerance Patient tolerated treatment well   Behavior During Therapy [facility] for tasks assessed/performed      No past medical history on file.  No past surgical history on file.  There were no vitals filed for this visit.      Subjective Assessment - 07/23/16 0820    Subjective  Still some numbness in the am and night time - but had some of it before in both hands - but not a whole lot - pain is better except if I bend my wrist down - but I can use my hand at home   Patient Stated Goals Want to get the pain better , my grip stronger, and more motion that I can use the tools, lift objects at work , fish , turn like bolts , tools    Currently in Pain? No/denies                      OT Treatments/Exercises (OP) - 07/23/16 0001      LUE Fluidotherapy   Number Minutes Fluidotherapy 10 Minutes   LUE Fluidotherapy Location Hand;Wrist   Comments AROM for wrist flexion , ext, sup/pro at [facility] to increase ROM        Assess ROM for wrist and grip/prehension As well as PRWHE semmes weinstein done   done fluido for wrist flexion AROM  BTE CPM for wrist flexion 200 sec  25 lbs BTE tool 701 wrist flexion - 120 sec  Increase to 58 flexion  Prior 52 degrees  Attempted dark blue putty but to hard  Pt to add 1/2 dark blue to his green - for increase resistance to what he can handle  pt to not over do and have increase pain Only at home  PROM for wrist flexion open hand 10 reps  2 x day  And putty 2 x 15 reps  But should not increase pain or numbness            OT Education - 07/23/16 0833    Education provided Yes   Education Details HEP changes   Person(s) Educated Patient   Methods Explanation;Demonstration;Tactile cues;Verbal cues;Handout   Comprehension Verbal cues required;Returned demonstration;Verbalized understanding          OT Short Term Goals - 07/23/16 1815      OT SHORT TERM GOAL #1   Title Pain on PRWHE improve by at least 15 points    Baseline Pain on PRWHE 32/50 at eval and now 16/50   Status Achieved     OT SHORT TERM GOAL #2   Title AROM for wrist in flexion improve by 20 degrees, and flexion/RD 5 degrees to tuck shirt in    Baseline AROm flexion 52 now R, L 80 R ;    Time 3   Period Weeks   Status On-going     OT SHORT TERM GOAL #3  Title Pt to be ind in HEP to decrease pain , increase ROM and strength in L hand to increase function on PRWHE by 15 points    Baseline PRWHE function score 23/50 at eval  and now 5/50   Status Achieved           OT Long Term Goals - 07/23/16 1816      OT LONG TERM GOAL #1   Title R grip strength improve by 10-15 lbs to carry 10 lbs or lift chair without pain, and turn tools at work    Baseline Grip 20 L ( now 40)  , R 81 lbs   Time 3   Period Weeks   Status Achieved     OT LONG TERM GOAL #2   Title L3 point grip improve with 3 lbs to turn bolts   Baseline see flowsheet   Status Achieved               Plan - 07/23/16 1813    Clinical Impression Statement Pt cont to have some numbness in R fingers but report he had some issues in the past with that in both hands at night , but not a lot - and now also mostly at night time - feels like he can do most all things except pick up something heavy - grip about the same - increase resistance to putty this date and ptto cont  to work on gentle PROM for flexion of wrist - but should not have  increase pain or numbness - will see him in 2 wks    Rehab Potential Good   OT Frequency Biweekly   OT Duration 4 weeks   OT Treatment/Interventions Self-care/ADL training;Fluidtherapy;Patient/family education;Therapeutic exercises;Passive range of motion;Manual Therapy   Plan reassess in 2 wks   OT Home Exercise Plan see pt instruction   Consulted and Agree with Plan of Care Patient      Patient will benefit from skilled therapeutic intervention in order to improve the following deficits and impairments:  Decreased range of motion, Impaired flexibility, Increased edema, Impaired UE functional use, Pain, Decreased strength  Visit Diagnosis: Pain in left wrist  Stiffness of left wrist, not elsewhere classified  Muscle weakness (generalized)    Problem List There are no active problems to display for this patient.   Rosalyn Gess OTR/L,CLT 07/23/2016, 6:18 PM  [place] [facility] [address] [place], [address] Phone: [phone]   Fax:  [phone]  Name: JAKORI SHEDDEN MRN: [id number] Date of Birth: 22-Jan-1956

## 2016-08-06 ENCOUNTER — Ambulatory Visit: Payer: Worker's Compensation | Admitting: Occupational Therapy

## 2016-08-06 DIAGNOSIS — M25632 Stiffness of left wrist, not elsewhere classified: Secondary | ICD-10-CM

## 2016-08-06 DIAGNOSIS — M6281 Muscle weakness (generalized): Secondary | ICD-10-CM

## 2016-08-06 DIAGNOSIS — M25532 Pain in left wrist: Secondary | ICD-10-CM

## 2016-08-06 NOTE — Patient Instructions (Signed)
Pt to cont with PROM for wrist flexion  Putty for grip and 3 point  But don't over do it with history of CT symptoms-

## 2016-08-06 NOTE — Therapy (Signed)
[facility] [address] [place], [address] Phone: [phone]   Fax:  [phone]  Occupational Therapy Treatment/discharge  Patient Details  Name: Nathan Holland MRN: [id number] Date of Birth: [birth year] Referring Provider: Mack Guise  Encounter Date: 08/06/2016      OT End of Session - 08/06/16 0838    Visit Number 7   Number of Visits 7   Date for OT Re-Evaluation 08/06/16   OT Start Time 0810   OT Stop Time 0835   OT Time Calculation (min) 25 min   Activity Tolerance Patient tolerated treatment well   Behavior During Therapy [facility] for tasks assessed/performed      No past medical history on file.  No past surgical history on file.  There were no vitals filed for this visit.      Subjective Assessment - 08/06/16 0835    Subjective  Doing good - here and there still some pain at work - but I think I am getting use to it - I do no pick up to heavy objects- numbness maybe 1-2 x wk - much better -try not to sleep wrong - did not do the putty as much - but did work on bending the wrist    Patient Stated Goals Want to get the pain better , my grip stronger, and more motion that I can use the tools, lift objects at work , fish , turn like bolts , tools    Currently in Pain? No/denies            [facility] OT Assessment - 08/06/16 0001      AROM   Left Wrist Extension 75 Degrees   Left Wrist Flexion 50 Degrees   Left Wrist Radial Deviation 20 Degrees   Left Wrist Ulnar Deviation 27 Degrees     Strength   Right Hand Grip (lbs) 81   Right Hand Lateral Pinch 20 lbs   Right Hand 3 Point Pinch 18 lbs   Left Hand Grip (lbs) 41   Left Hand Lateral Pinch 19 lbs   Left Hand 3 Point Pinch 13 lbs     Measurements taken for ROM , grip and prehension Pt report still some edema over ulnar styloid  Review HEP to cont with but do not increase pain or numbness - putty for grip/3 point(mix dark blue with  green) -wrist flexion PROM   PRWHE done for pain                       OT Education - 08/06/16 0838    Education provided Yes   Education Details findings of assessment - progress and discharge instructions   Person(s) Educated Patient   Methods Explanation;Demonstration;Tactile cues;Verbal cues   Comprehension Verbal cues required;Returned demonstration;Verbalized understanding          OT Short Term Goals - 08/06/16 0842      OT SHORT TERM GOAL #1   Title Pain on PRWHE improve by at least 15 points    Baseline Pain on PRWHE 32/50 at eval and now 14/50   Status Achieved     OT SHORT TERM GOAL #2   Title AROM for wrist in flexion improve by 20 degrees, and flexion/RD 5 degrees to tuck shirt in    Baseline AROm flexion 52 now   Status Partially Met     OT SHORT TERM GOAL #3   Title Pt to be ind in HEP to  decrease pain , increase ROM and strength in L hand to increase function on PRWHE by 15 points    Baseline PRWHE function score 23/50 at eval  and now 5/50   Status Achieved           OT Long Term Goals - 08/06/16 0845      OT LONG TERM GOAL #1   Title R grip strength improve by 10-15 lbs to carry 10 lbs or lift chair without pain, and turn tools at work    Baseline Grip 20 L ( now 40)  , R 81 lbs   Status Achieved     OT LONG TERM GOAL #2   Title L3 point grip improve with 3 lbs to turn bolts   Status Achieved               Plan - 08/06/16 0839    Clinical Impression Statement Pt made progress in wrist AROM - ext more than flexion - grip doubled and lat/3 point increase - pt report here and there feeling pain with using hand at work - but do realize it some  times- not picking up heavy - pt report having history of some CT symptoms - pt to  still work on flexion ofwrist and grip /3 point with putty - but  not over do it because of CT symptoms -  feel it less than 3 x wk  if sleeping wrong - not wearing any splints anymore - pain decrease  from 30's to 14 /50 on PRWHE - pt discharge with HEP    OT Treatment/Interventions Self-care/ADL training;Fluidtherapy;Patient/family education;Therapeutic exercises;Passive range of motion;Manual Therapy   Plan discharge with HEP   OT Home Exercise Plan see pt instruction   Consulted and Agree with Plan of Care Patient      Patient will benefit from skilled therapeutic intervention in order to improve the following deficits and impairments:     Visit Diagnosis: Pain in left wrist  Stiffness of left wrist, not elsewhere classified  Muscle weakness (generalized)    Problem List There are no active problems to display for this patient.   Rosalyn Gess OTR/L,CLT 08/06/2016, 8:46 AM  [facility] [address] [place], [address] Phone: [phone]   Fax:  [phone]  Name: Nathan Holland MRN: [id number] Date of Birth: Aug 08, 1956

## 2016-12-16 ENCOUNTER — Other Ambulatory Visit: Payer: Self-pay | Admitting: Orthopedic Surgery

## 2016-12-16 DIAGNOSIS — R310 Gross hematuria: Secondary | ICD-10-CM

## 2016-12-20 ENCOUNTER — Ambulatory Visit: Payer: BLUE CROSS/BLUE SHIELD

## 2017-01-01 ENCOUNTER — Encounter: Payer: Self-pay | Admitting: *Deleted

## 2017-01-01 DIAGNOSIS — N200 Calculus of kidney: Secondary | ICD-10-CM | POA: Diagnosis not present

## 2017-01-02 ENCOUNTER — Encounter: Payer: Self-pay | Admitting: *Deleted

## 2017-01-02 ENCOUNTER — Ambulatory Visit
Admission: RE | Admit: 2017-01-02 | Discharge: 2017-01-02 | Disposition: A | Discharge status: Home / Self Care | Payer: BLUE CROSS/BLUE SHIELD | Source: Ambulatory Visit | Attending: Urology | Admitting: Urology

## 2017-01-02 ENCOUNTER — Encounter
Admission: RE | Disposition: A | Discharge status: Home / Self Care | Payer: Self-pay | Source: Ambulatory Visit | Attending: Urology

## 2017-01-02 DIAGNOSIS — N200 Calculus of kidney: Secondary | ICD-10-CM | POA: Insufficient documentation

## 2017-01-02 HISTORY — PX: EXTRACORPOREAL SHOCK WAVE LITHOTRIPSY: SHX1557

## 2017-01-02 HISTORY — DX: Personal history of urinary calculi: Z87.442

## 2017-01-02 HISTORY — DX: Malignant (primary) neoplasm, unspecified: C80.1

## 2017-01-02 SURGERY — LITHOTRIPSY, ESWL
Anesthesia: Moderate Sedation | Laterality: Left

## 2017-01-02 MED ORDER — MORPHINE SULFATE (PF) 10 MG/ML IV SOLN
INTRAVENOUS | Status: AC
  Administered 2017-01-02: 10 mg via INTRAMUSCULAR
  Filled 2017-01-02: qty 1

## 2017-01-02 MED ORDER — PROMETHAZINE HCL 25 MG/ML IJ SOLN
25.0000 mg | Freq: Once | INTRAMUSCULAR | Status: AC
  Administered 2017-01-02: 25 mg via INTRAMUSCULAR

## 2017-01-02 MED ORDER — DIAZEPAM 5 MG PO TABS
10.0000 mg | ORAL_TABLET | ORAL | Status: DC
Start: 2017-01-02 — End: 2017-01-02

## 2017-01-02 MED ORDER — ONDANSETRON 8 MG PO TBDP: 8.0000 mg | ORAL_TABLET | Freq: Four times a day (QID) | ORAL | 3 refills | Status: DC | PRN

## 2017-01-02 MED ORDER — LEVOFLOXACIN IN D5W 500 MG/100ML IV SOLN: 500.0000 mg | INTRAVENOUS | Status: DC

## 2017-01-02 MED ORDER — FUROSEMIDE 10 MG/ML IJ SOLN
INTRAMUSCULAR | Status: AC
  Administered 2017-01-02: 10 mg via INTRAVENOUS
  Filled 2017-01-02: qty 2

## 2017-01-02 MED ORDER — HYDROCODONE-ACETAMINOPHEN 10-325 MG PO TABS: 1.0000 | ORAL_TABLET | ORAL | 0 refills | Status: DC | PRN

## 2017-01-02 MED ORDER — DIPHENHYDRAMINE HCL 25 MG PO CAPS
25.0000 mg | ORAL_CAPSULE | ORAL | Status: AC
  Administered 2017-01-02: 25 mg via ORAL

## 2017-01-02 MED ORDER — TAMSULOSIN HCL 0.4 MG PO CAPS: 0.4000 mg | ORAL_CAPSULE | Freq: Every day | ORAL | 11 refills | Status: DC

## 2017-01-02 MED ORDER — MORPHINE SULFATE (PF) 10 MG/ML IV SOLN
10.0000 mg | Freq: Once | INTRAVENOUS | Status: AC
Start: 2017-01-02 — End: 2017-01-02
  Administered 2017-01-02: 10 mg via INTRAMUSCULAR

## 2017-01-02 MED ORDER — PROMETHAZINE HCL 25 MG/ML IJ SOLN
INTRAMUSCULAR | Status: AC
  Administered 2017-01-02: 25 mg via INTRAMUSCULAR
  Filled 2017-01-02: qty 1

## 2017-01-02 MED ORDER — DIPHENHYDRAMINE HCL 25 MG PO CAPS
ORAL_CAPSULE | ORAL | Status: AC
  Administered 2017-01-02: 25 mg via ORAL
  Filled 2017-01-02: qty 1

## 2017-01-02 MED ORDER — CIPROFLOXACIN HCL 500 MG PO TABS: 500.0000 mg | ORAL_TABLET | Freq: Two times a day (BID) | ORAL | 0 refills | Status: DC

## 2017-01-02 MED ORDER — MIDAZOLAM HCL 2 MG/2ML IJ SOLN
INTRAMUSCULAR | Status: AC
  Administered 2017-01-02: 1 mg via INTRAMUSCULAR
  Filled 2017-01-02: qty 2

## 2017-01-02 MED ORDER — DOCUSATE SODIUM 100 MG PO CAPS: 200.0000 mg | ORAL_CAPSULE | Freq: Two times a day (BID) | ORAL | 3 refills | Status: DC

## 2017-01-02 MED ORDER — FUROSEMIDE 10 MG/ML IJ SOLN
10.0000 mg | Freq: Once | INTRAMUSCULAR | Status: AC
  Administered 2017-01-02: 10 mg via INTRAVENOUS

## 2017-01-02 MED ORDER — DEXTROSE-NACL 5-0.45 % IV SOLN
INTRAVENOUS | Status: DC
  Administered 2017-01-02: 12:00:00 via INTRAVENOUS

## 2017-01-02 MED ORDER — LEVOFLOXACIN 500 MG PO TABS
ORAL_TABLET | ORAL | Status: AC
  Administered 2017-01-02: 500 mg
  Filled 2017-01-02: qty 1

## 2017-01-02 NOTE — Discharge Instructions (Signed)
Lithotripsy, Care After °This sheet gives you information about how to care for yourself after your procedure. Your health care provider may also give you more specific instructions. If you have problems or questions, contact your health care provider. °What can I expect after the procedure? °After the procedure, it is common to have: °· Some blood in your urine. This should only last for a few days. °· Soreness in your back, sides, or upper abdomen for a few days. °· Blotches or bruises on your back where the pressure wave entered the skin. °· Pain, discomfort, or nausea when pieces (fragments) of the kidney stone move through the tube that carries urine from the kidney to the bladder (ureter). Stone fragments may pass soon after the procedure, but they may continue to pass for up to 4-8 weeks. °? If you have severe pain or nausea, contact your health care provider. This may be caused by a large stone that was not broken up, and this may mean that you need more treatment. °· Some pain or discomfort during urination. °· Some pain or discomfort in the lower abdomen or (in men) at the base of the penis. ° °Follow these instructions at home: °Medicines °· Take over-the-counter and prescription medicines only as told by your health care provider. °· If you were prescribed an antibiotic medicine, take it as told by your health care provider. Do not stop taking the antibiotic even if you start to feel better. °· Do not drive for 24 hours if you were given a medicine to help you relax (sedative). °· Do not drive or use heavy machinery while taking prescription pain medicine. °Eating and drinking °· Drink enough water and fluids to keep your urine clear or pale yellow. This helps any remaining pieces of the stone to pass. It can also help prevent new stones from forming. °· Eat plenty of fresh fruits and vegetables. °· Follow instructions from your health care provider about eating and drinking restrictions. You may be  instructed: °? To reduce how much salt (sodium) you eat or drink. Check ingredients and nutrition facts on packaged foods and beverages. °? To reduce how much meat you eat. °· Eat the recommended amount of calcium for your age and gender. Ask your health care provider how much calcium you should have. °General instructions °· Get plenty of rest. °· Most people can resume normal activities 1-2 days after the procedure. Ask your health care provider what activities are safe for you. °· If directed, strain all urine through the strainer that was provided by your health care provider. °? Keep all fragments for your health care provider to see. Any stones that are found may be sent to a medical lab for examination. The stone may be as small as a grain of salt. °· Keep all follow-up visits as told by your health care provider. This is important. °Contact a health care provider if: °· You have pain that is severe or does not get better with medicine. °· You have nausea that is severe or does not go away. °· You have blood in your urine longer than your health care provider told you to expect. °· You have more blood in your urine. °· You have pain during urination that does not go away. °· You urinate more frequently than usual and this does not go away. °· You develop a rash or any other possible signs of an allergic reaction. °Get help right away if: °· You have severe pain in   your back, sides, or upper abdomen. °· You have severe pain while urinating. °· Your urine is very dark red. °· You have blood in your stool (feces). °· You cannot pass any urine at all. °· You feel a strong urge to urinate after emptying your bladder. °· You have a fever or chills. °· You develop shortness of breath, difficulty breathing, or chest pain. °· You have severe nausea that leads to persistent vomiting. °· You faint. °Summary °· After this procedure, it is common to have some pain, discomfort, or nausea when pieces (fragments) of the  kidney stone move through the tube that carries urine from the kidney to the bladder (ureter). If this pain or nausea is severe, however, you should contact your health care provider. °· Most people can resume normal activities 1-2 days after the procedure. Ask your health care provider what activities are safe for you. °· Drink enough water and fluids to keep your urine clear or pale yellow. This helps any remaining pieces of the stone to pass, and it can help prevent new stones from forming. °· If directed, strain your urine and keep all fragments for your health care provider to see. Fragments or stones may be as small as a grain of salt. °· Get help right away if you have severe pain in your back, sides, or upper abdomen or have severe pain while urinating. °This information is not intended to replace advice given to you by your health care provider. Make sure you discuss any questions you have with your health care provider. °Document Released: 10/13/2007 Document Revised: 08/14/2016 Document Reviewed: 08/14/2016 °Elsevier Interactive Patient Education © 2017 [employer] ° °

## 2017-01-03 ENCOUNTER — Encounter: Payer: Self-pay | Admitting: Urology

## 2017-03-17 ENCOUNTER — Emergency Department
Admission: EM | Admit: 2017-03-17 | Discharge: 2017-03-17 | Disposition: A | Discharge status: Home / Self Care | Payer: BLUE CROSS/BLUE SHIELD | Attending: Emergency Medicine | Admitting: Emergency Medicine

## 2017-03-17 ENCOUNTER — Emergency Department: Payer: BLUE CROSS/BLUE SHIELD

## 2017-03-17 ENCOUNTER — Encounter: Payer: Self-pay | Admitting: Emergency Medicine

## 2017-03-17 DIAGNOSIS — Z8546 Personal history of malignant neoplasm of prostate: Secondary | ICD-10-CM | POA: Insufficient documentation

## 2017-03-17 DIAGNOSIS — Z79899 Other long term (current) drug therapy: Secondary | ICD-10-CM | POA: Diagnosis not present

## 2017-03-17 DIAGNOSIS — Z87891 Personal history of nicotine dependence: Secondary | ICD-10-CM | POA: Diagnosis not present

## 2017-03-17 DIAGNOSIS — R079 Chest pain, unspecified: Secondary | ICD-10-CM | POA: Insufficient documentation

## 2017-03-17 LAB — TROPONIN I
Troponin I: <0.03 ng/mL (ref <0.03)
Troponin I: <0.03 ng/mL (ref <0.03)

## 2017-03-17 LAB — CBC
HCT: 47.2 % (ref 40.0–52.0)
Hemoglobin: 16.2 g/dL (ref 13.0–18.0)
MCH: 30.3 pg (ref 26.0–34.0)
MCHC: 34.4 g/dL (ref 32.0–36.0)
MCV: 88.1 fL (ref 80.0–100.0)
PLATELETS: 175 10*3/uL (ref 150–440)
RBC: 5.35 MIL/uL (ref 4.40–5.90)
RDW: 13.3 % (ref 11.5–14.5)
WBC: 10 10*3/uL (ref 3.8–10.6)

## 2017-03-17 LAB — BASIC METABOLIC PANEL
Anion gap: 10 (ref 5–15)
BUN: 17 mg/dL (ref 6–20)
CHLORIDE: 105 mmol/L (ref 101–111)
CO2: 22 mmol/L (ref 22–32)
CREATININE: 0.88 mg/dL (ref 0.61–1.24)
Calcium: 9.3 mg/dL (ref 8.9–10.3)
GFR calc Af Amer: >60 mL/min (ref >60)
GLUCOSE: 183 mg/dL — AB (ref 65–99)
Potassium: 3.7 mmol/L (ref 3.5–5.1)
SODIUM: 137 mmol/L (ref 135–145)

## 2017-03-17 MED ORDER — SUCRALFATE 1 G PO TABS: 1.0000 g | ORAL_TABLET | Freq: Four times a day (QID) | ORAL | 0 refills | Status: DC

## 2017-03-17 MED ORDER — FAMOTIDINE 40 MG PO TABS: 40.0000 mg | ORAL_TABLET | Freq: Every evening | ORAL | 1 refills | Status: DC

## 2017-03-17 MED ORDER — GI COCKTAIL ~~LOC~~
30.0000 mL | Freq: Once | ORAL | Status: AC
  Administered 2017-03-17: 30 mL via ORAL
  Filled 2017-03-17: qty 30

## 2017-03-17 NOTE — ED Triage Notes (Signed)
Pt reports central CP that sometimes radiates to back today. Pt reports feeling SOB. Pt reports has had some cough over the past couple of weeks. Pt in no apparent distress in triage.

## 2017-03-17 NOTE — Discharge Instructions (Signed)
Please seek medical attention for any high fevers, chest pain, shortness of breath, change in behavior, persistent vomiting, bloody stool or any other new or concerning symptoms.  

## 2017-03-17 NOTE — ED Provider Notes (Signed)
[facility] Emergency Department Provider Note  ____________________________________________   I have reviewed the triage vital signs and the nursing notes.   HISTORY  Chief Complaint Chest Pain   History limited by: Not Limited   HPI Nathan Holland is a 61 y.o. male who presents to the emergency department today because of chest pain. It is located centrally. It started this morning. It has been constant since it started. He describes it as a slight pressure-like feeling. It has radiated to his right shoulder. He has not had any associated shortness of breath. He denies any fevers. Denies any similar pain in the past.   Past Medical History:  Diagnosis Date  . Cancer [facility]) 2012   prostate with seed placement  . History of kidney stones 2011,2012   previous lithotripsies    There are no active problems to display for this patient.   Past Surgical History:  Procedure Laterality Date  . EXTRACORPOREAL SHOCK WAVE LITHOTRIPSY  2011,2012  . EXTRACORPOREAL SHOCK WAVE LITHOTRIPSY Left 01/02/2017   Procedure: EXTRACORPOREAL SHOCK WAVE LITHOTRIPSY (ESWL);  Surgeon: Royston Cowper, MD;  Location: [facility] ORS;  Service: Urology;  Laterality: Left;    Prior to Admission medications   Medication Sig Start Date End Date Taking? Authorizing Provider  ciprofloxacin (CIPRO) 500 MG tablet Take 1 tablet (500 mg total) by mouth 2 (two) times daily. 01/02/17   Royston Cowper, MD  docusate sodium (COLACE) 100 MG capsule Take 2 capsules (200 mg total) by mouth 2 (two) times daily. 01/02/17   Royston Cowper, MD  HYDROcodone-acetaminophen (NORCO) 10-325 MG tablet Take 1-2 tablets by mouth every 4 (four) hours as needed for moderate pain. Maximum dose per 24 hours -8 pills. 01/02/17   Royston Cowper, MD  ondansetron (ZOFRAN ODT) 8 MG disintegrating tablet Take 1 tablet (8 mg total) by mouth every 6 (six) hours as needed for nausea or vomiting. 01/02/17   Royston Cowper,  MD  tamsulosin (FLOMAX) 0.4 MG CAPS capsule Take 1 capsule (0.4 mg total) by mouth daily. 01/02/17   Royston Cowper, MD    Allergies Patient has no known allergies.  No family history on file.  Social History Social History  Substance Use Topics  . Smoking status: Former Smoker    Packs/day: 1.00    Quit date: 11/06/2005  . Smokeless tobacco: Never Used  . Alcohol use No    Review of Systems Constitutional: No fever/chills Eyes: No visual changes. ENT: No sore throat. Cardiovascular: Positive for chest pain. Respiratory: Denies shortness of breath. Gastrointestinal: No abdominal pain.  No nausea, no vomiting.  No diarrhea.   Genitourinary: Negative for dysuria. Musculoskeletal: Negative for back pain. Skin: Negative for rash. Neurological: Negative for headaches, focal weakness or numbness.  ____________________________________________   PHYSICAL EXAM:  VITAL SIGNS: ED Triage Vitals  Enc Vitals Group     BP 03/17/17 1402 (!) 151/81     Pulse Rate 03/17/17 1402 92     Resp 03/17/17 1402 20     Temp 03/17/17 1402 97.7 F (36.5 C)     Temp Source 03/17/17 1402 Oral     SpO2 03/17/17 1402 96 %     Weight 03/17/17 1402 275 lb (124.7 kg)     Height 03/17/17 1402 5\' 11"  (1.803 m)     Head Circumference --      Peak Flow --      Pain Score 03/17/17 1401 4   Constitutional: Alert and oriented.  Well appearing and in no distress. Eyes: Conjunctivae are normal.  ENT   Head: Normocephalic and atraumatic.   Nose: No congestion/rhinnorhea.   Mouth/Throat: Mucous membranes are moist.   Neck: No stridor. Hematological/Lymphatic/Immunilogical: No cervical lymphadenopathy. Cardiovascular: Normal rate, regular rhythm.  No murmurs, rubs, or gallops.  Respiratory: Normal respiratory effort without tachypnea nor retractions. Breath sounds are clear and equal bilaterally. No wheezes/rales/rhonchi. Gastrointestinal: Soft and non tender. No rebound. No guarding.   Genitourinary: Deferred Musculoskeletal: Normal range of motion in all extremities. No lower extremity edema. Neurologic:  Normal speech and language. No gross focal neurologic deficits are appreciated.  Skin:  Skin is warm, dry and intact. No rash noted. Psychiatric: Mood and affect are normal. Speech and behavior are normal. Patient exhibits appropriate insight and judgment.  ____________________________________________    LABS (pertinent positives/negatives)  Labs Reviewed  BASIC METABOLIC PANEL - Abnormal; Notable for the following:       Result Value   Glucose, Bld 183 (*)    All other components within normal limits  CBC  TROPONIN I  TROPONIN I     ____________________________________________   EKG  I, Nance Pear, attending physician, personally viewed and interpreted this EKG  EKG Time: 1352 Rate: 87 Rhythm: normal sinus rhythm Axis: normal Intervals: qtc 440 QRS: narrow ST changes: no st elevation Impression: normal ekg  ____________________________________________    RADIOLOGY  CXR IMPRESSION: No edema or consolidation. ____________________________________________   PROCEDURES  Procedures  ____________________________________________   INITIAL IMPRESSION / ASSESSMENT AND PLAN / ED COURSE  Pertinent labs & imaging results that were available during my care of the patient were reviewed by me and considered in my medical decision making (see chart for details).  Patient presented to the emergency department today with chest pain. 2 sets of troponin were negative here. Patient did feel some relief after GI cocktail. Will plan on discharging with sucralfate and Pepcid.  ____________________________________________   FINAL CLINICAL IMPRESSION(S) / ED DIAGNOSES  Final diagnoses:  Nonspecific chest pain     Note: This dictation was prepared with Dragon dictation. Any transcriptional errors that result from this process are  unintentional     Nance Pear, MD 03/17/17 2006

## 2018-03-25 ENCOUNTER — Encounter: Payer: Self-pay | Admitting: *Deleted

## 2018-03-25 ENCOUNTER — Other Ambulatory Visit: Payer: Self-pay

## 2018-03-26 NOTE — Discharge Instructions (Signed)
[facility] °[facility] °ENDOSCOPIC SINUS SURGERY °[facility] ° °What is Functional Endoscopic Sinus Surgery? ° The Surgery involves making the natural openings of the sinuses larger by removing the bony partitions that separate the sinuses from the nasal cavity.  The natural sinus lining is preserved as much as possible to allow the sinuses to resume normal function after the surgery.  In some patients nasal polyps (excessively swollen lining of the sinuses) may be removed to relieve obstruction of the sinus openings.  The surgery is performed through the nose using lighted scopes, which eliminates the need for incisions on the face.  A septoplasty is a different procedure which is sometimes performed with sinus surgery.  It involves straightening the boy partition that separates the two sides of your nose.  A crooked or deviated septum may need repair if is obstructing the sinuses or nasal airflow.  Turbinate reduction is also often performed during sinus surgery.  The turbinates are bony proturberances from the side walls of the nose which swell and can obstruct the nose in patients with sinus and allergy problems.  Their size can be surgically reduced to help relieve nasal obstruction. ° °What Can Sinus Surgery Do For Me? ° Sinus surgery can reduce the frequency of sinus infections requiring antibiotic treatment.  This can provide improvement in nasal congestion, post-nasal drainage, facial pressure and nasal obstruction.  Surgery will NOT prevent you from ever having an infection again, so it usually only for patients who get infections 4 or more times yearly requiring antibiotics, or for infections that do not clear with antibiotics.  It will not cure nasal allergies, so patients with allergies may still require medication to treat their allergies after surgery. Surgery may improve headaches related to sinusitis, however, some people will continue to  require medication to control sinus headaches related to allergies.  Surgery will do nothing for other forms of headache (migraine, tension or cluster). ° °What Are the Risks of Endoscopic Sinus Surgery? ° Current techniques allow surgery to be performed safely with little risk, however, there are rare complications that patients should be aware of.  Because the sinuses are located around the eyes, there is risk of eye injury, including blindness, though again, this would be quite rare. This is usually a result of bleeding behind the eye during surgery, which puts the vision oat risk, though there are treatments to protect the vision and prevent permanent disrupted by surgery causing a leak of the spinal fluid that surrounds the brain.  More serious complications would include bleeding inside the brain cavity or damage to the brain.  Again, all of these complications are uncommon, and spinal fluid leaks can be safely managed surgically if they occur.  The most common complication of sinus surgery is bleeding from the nose, which may require packing or cauterization of the nose.  Continued sinus have polyps may experience recurrence of the polyps requiring revision surgery.  Alterations of sense of smell or injury to the tear ducts are also rare complications.  ° °What is the Surgery Like, and what is the Recovery? ° The Surgery usually takes a couple of hours to perform, and is usually performed under a general anesthetic (completely asleep).  Patients are usually discharged home after a couple of hours.  Sometimes during surgery it is necessary to pack the nose to control bleeding, and the packing is left in place for 24 - 48 hours, and removed by your surgeon.    If a septoplasty was performed during the procedure, there is often a splint placed which must be removed after 5-7 days.   °Discomfort: Pain is usually mild to moderate, and can be controlled by prescription pain medication or acetaminophen (Tylenol).   Aspirin, Ibuprofen (Advil, Motrin), or Naprosyn (Aleve) should be avoided, as they can cause increased bleeding.  Most patients feel sinus pressure like they have a bad head cold for several days.  Sleeping with your head elevated can help reduce swelling and facial pressure, as can ice packs over the face.  A humidifier may be helpful to keep the mucous and blood from drying in the nose.  ° °Diet: There are no specific diet restrictions, however, you should generally start with clear liquids and a light diet of bland foods because the anesthetic can cause some nausea.  Advance your diet depending on how your stomach feels.  Taking your pain medication with food will often help reduce stomach upset which pain medications can cause. ° °Nasal Saline Irrigation: It is important to remove blood clots and dried mucous from the nose as it is healing.  This is done by having you irrigate the nose at least 3 - 4 times daily with a salt water solution.  We recommend using NeilMed Sinus Rinse (available at the drug store).  Fill the squeeze bottle with the solution, bend over a sink, and insert the tip of the squeeze bottle into the nose ½ of an inch.  Point the tip of the squeeze bottle towards the inside corner of the eye on the same side your irrigating.  Squeeze the bottle and gently irrigate the nose.  If you bend forward as you do this, most of the fluid will flow back out of the nose, instead of down your throat.   The solution should be warm, near body temperature, when you irrigate.   Each time you irrigate, you should use a full squeeze bottle.  ° °Note that if you are instructed to use Nasal Steroid Sprays at any time after your surgery, irrigate with saline BEFORE using the steroid spray, so you do not wash it all out of the nose. °Another product, Nasal Saline Gel (such as AYR Nasal Saline Gel) can be applied in each nostril 3 - 4 times daily to moisture the nose and reduce scabbing or crusting. ° °Bleeding:   Bloody drainage from the nose can be expected for several days, and patients are instructed to irrigate their nose frequently with salt water to help remove mucous and blood clots.  The drainage may be dark red or brown, though some fresh blood may be seen intermittently, especially after irrigation.  Do not blow you nose, as bleeding may occur. If you must sneeze, keep your mouth open to allow air to escape through your mouth. ° °If heavy bleeding occurs: Irrigate the nose with saline to rinse out clots, then spray the nose 3 - 4 times with Afrin Nasal Decongestant Spray.  The spray will constrict the blood vessels to slow bleeding.  Pinch the lower half of your nose shut to apply pressure, and lay down with your head elevated.  Ice packs over the nose may help as well. If bleeding persists despite these measures, you should notify your doctor.  Do not use the Afrin routinely to control nasal congestion after surgery, as it can result in worsening congestion and may affect healing.  ° ° ° °Activity: Return to work varies among patients. Most patients will be   out of work at least 5 - 7 days to recover.  Patient may return to work after they are off of narcotic pain medication, and feeling well enough to perform the functions of their job.  Patients must avoid heavy lifting (over 10 pounds) or strenuous physical for 2 weeks after surgery, so your employer may need to assign you to light duty, or keep you out of work longer if light duty is not possible.  NOTE: you should not drive, operate dangerous machinery, do any mentally demanding tasks or make any important legal or financial decisions while on narcotic pain medication and recovering from the general anesthetic.  °  °Call Your Doctor Immediately if You Have Any of the Following: °1. Bleeding that you cannot control with the above measures °2. Loss of vision, double vision, bulging of the eye or black eyes. °3. Fever over 101 degrees °4. Neck stiffness with  severe headache, fever, nausea and change in mental state. °You are always encourage to call anytime with concerns, however, please call with requests for pain medication refills during office hours. ° °Office Endoscopy: During follow-up visits your doctor will remove any packing or splints that may have been placed and evaluate and clean your sinuses endoscopically.  Topical anesthetic will be used to make this as comfortable as possible, though you may want to take your pain medication prior to the visit.  How often this will need to be done varies from patient to patient.  After complete recovery from the surgery, you may need follow-up endoscopy from time to time, particularly if there is concern of recurrent infection or nasal polyps. ° ° °General Anesthesia, Adult, Care After °These instructions provide you with information about caring for yourself after your procedure. Your health care provider may also give you more specific instructions. Your treatment has been planned according to current medical practices, but problems sometimes occur. Call your health care provider if you have any problems or questions after your procedure. °What can I expect after the procedure? °After the procedure, it is common to have: °· Vomiting. °· A sore throat. °· Mental slowness. ° °It is common to feel: °· Nauseous. °· Cold or shivery. °· Sleepy. °· Tired. °· Sore or achy, even in parts of your body where you did not have surgery. ° °Follow these instructions at home: °For at least 24 hours after the procedure: °· Do not: °? Participate in activities where you could fall or become injured. °? Drive. °? Use heavy machinery. °? Drink alcohol. °? Take sleeping pills or medicines that cause drowsiness. °? Make important decisions or sign legal documents. °? Take care of children on your own. °· Rest. °Eating and drinking °· If you vomit, drink water, juice, or soup when you can drink without vomiting. °· Drink enough fluid to  keep your urine clear or pale yellow. °· Make sure you have little or no nausea before eating solid foods. °· Follow the diet recommended by your health care provider. °General instructions °· Have a responsible adult stay with you until you are awake and alert. °· Return to your normal activities as told by your health care provider. Ask your health care provider what activities are safe for you. °· Take over-the-counter and prescription medicines only as told by your health care provider. °· If you smoke, do not smoke without supervision. °· Keep all follow-up visits as told by your health care provider. This is important. °Contact a health care provider if: °· You   continue to have nausea or vomiting at home, and medicines are not helpful. °· You cannot drink fluids or start eating again. °· You cannot urinate after 8-12 hours. °· You develop a skin rash. °· You have fever. °· You have increasing redness at the site of your procedure. °Get help right away if: °· You have difficulty breathing. °· You have chest pain. °· You have unexpected bleeding. °· You feel that you are having a life-threatening or urgent problem. °This information is not intended to replace advice given to you by your health care provider. Make sure you discuss any questions you have with your health care provider. °Document Released: 12/30/2000 Document Revised: 02/26/2016 Document Reviewed: 09/07/2015 °Elsevier Interactive Patient Education © 2018 [employer] ° °

## 2018-03-31 ENCOUNTER — Ambulatory Visit
Admission: RE | Admit: 2018-03-31 | Discharge: 2018-03-31 | Disposition: A | Discharge status: Home / Self Care | Payer: BLUE CROSS/BLUE SHIELD | Source: Ambulatory Visit | Attending: Otolaryngology | Admitting: Otolaryngology

## 2018-03-31 ENCOUNTER — Encounter
Admission: RE | Disposition: A | Discharge status: Home / Self Care | Payer: Self-pay | Source: Ambulatory Visit | Attending: Otolaryngology

## 2018-03-31 ENCOUNTER — Ambulatory Visit: Payer: BLUE CROSS/BLUE SHIELD | Admitting: Anesthesiology

## 2018-03-31 DIAGNOSIS — J329 Chronic sinusitis, unspecified: Secondary | ICD-10-CM | POA: Insufficient documentation

## 2018-03-31 DIAGNOSIS — Z8546 Personal history of malignant neoplasm of prostate: Secondary | ICD-10-CM | POA: Diagnosis not present

## 2018-03-31 DIAGNOSIS — J338 Other polyp of sinus: Secondary | ICD-10-CM | POA: Diagnosis not present

## 2018-03-31 HISTORY — PX: IMAGE GUIDED SINUS SURGERY: SHX6570

## 2018-03-31 HISTORY — PX: MAXILLARY ANTROSTOMY: SHX2003

## 2018-03-31 HISTORY — PX: ETHMOIDECTOMY: SHX5197

## 2018-03-31 SURGERY — SINUS SURGERY, WITH IMAGING GUIDANCE
Anesthesia: General | Site: Nose | Laterality: Bilateral | Wound class: Clean Contaminated

## 2018-03-31 MED ORDER — ACETAMINOPHEN 10 MG/ML IV SOLN
1000.0000 mg | Freq: Once | INTRAVENOUS | Status: AC
  Administered 2018-03-31: 1000 mg via INTRAVENOUS

## 2018-03-31 MED ORDER — OXYCODONE HCL 5 MG/5ML PO SOLN: 5.0000 mg | Freq: Once | ORAL | Status: AC | PRN

## 2018-03-31 MED ORDER — GLYCOPYRROLATE 0.2 MG/ML IJ SOLN
INTRAMUSCULAR | Status: DC | PRN
  Administered 2018-03-31: .1 mg via INTRAVENOUS

## 2018-03-31 MED ORDER — LIDOCAINE-EPINEPHRINE 1 %-1:100000 IJ SOLN
INTRAMUSCULAR | Status: DC | PRN
  Administered 2018-03-31: 2 mL
  Administered 2018-03-31: 3 mL

## 2018-03-31 MED ORDER — ACETAMINOPHEN 160 MG/5ML PO SOLN: 325.0000 mg | ORAL | Status: DC | PRN

## 2018-03-31 MED ORDER — FENTANYL CITRATE (PF) 100 MCG/2ML IJ SOLN
INTRAMUSCULAR | Status: DC | PRN
  Administered 2018-03-31 (×2): 50 ug via INTRAVENOUS

## 2018-03-31 MED ORDER — OXYCODONE HCL 5 MG PO TABS
5.0000 mg | ORAL_TABLET | Freq: Once | ORAL | Status: AC | PRN
  Administered 2018-03-31: 5 mg via ORAL

## 2018-03-31 MED ORDER — ONDANSETRON HCL 4 MG/2ML IJ SOLN
INTRAMUSCULAR | Status: DC | PRN
  Administered 2018-03-31: 4 mg via INTRAVENOUS

## 2018-03-31 MED ORDER — HYDROCODONE-ACETAMINOPHEN 5-325 MG PO TABS: 1.0000 | ORAL_TABLET | Freq: Four times a day (QID) | ORAL | 0 refills | Status: DC | PRN

## 2018-03-31 MED ORDER — PROMETHAZINE HCL 25 MG/ML IJ SOLN: 6.2500 mg | INTRAMUSCULAR | Status: DC | PRN

## 2018-03-31 MED ORDER — LACTATED RINGERS IV SOLN
10.0000 mL/h | INTRAVENOUS | Status: DC
  Administered 2018-03-31: 11:00:00 via INTRAVENOUS
  Administered 2018-03-31: 10 mL/h via INTRAVENOUS

## 2018-03-31 MED ORDER — PROPOFOL 10 MG/ML IV BOLUS
INTRAVENOUS | Status: DC | PRN
  Administered 2018-03-31: 40 mg via INTRAVENOUS
  Administered 2018-03-31: 160 mg via INTRAVENOUS

## 2018-03-31 MED ORDER — PREDNISONE 10 MG (48) PO TBPK: ORAL_TABLET | ORAL | 0 refills | Status: DC

## 2018-03-31 MED ORDER — DEXAMETHASONE SODIUM PHOSPHATE 4 MG/ML IJ SOLN
INTRAMUSCULAR | Status: DC | PRN
  Administered 2018-03-31: 10 mg via INTRAVENOUS

## 2018-03-31 MED ORDER — CEFDINIR 300 MG PO CAPS: 300.0000 mg | ORAL_CAPSULE | Freq: Two times a day (BID) | ORAL | 0 refills | Status: DC

## 2018-03-31 MED ORDER — ACETAMINOPHEN 325 MG PO TABS: 325.0000 mg | ORAL_TABLET | ORAL | Status: DC | PRN

## 2018-03-31 MED ORDER — FENTANYL CITRATE (PF) 100 MCG/2ML IJ SOLN: 25.0000 ug | INTRAMUSCULAR | Status: DC | PRN

## 2018-03-31 MED ORDER — OXYMETAZOLINE HCL 0.05 % NA SOLN
NASAL | Status: DC | PRN
  Administered 2018-03-31: 1 via TOPICAL

## 2018-03-31 MED ORDER — LIDOCAINE HCL (CARDIAC) PF 100 MG/5ML IV SOSY
PREFILLED_SYRINGE | INTRAVENOUS | Status: DC | PRN
  Administered 2018-03-31: 40 mg via INTRAVENOUS

## 2018-03-31 MED ORDER — ROCURONIUM BROMIDE 100 MG/10ML IV SOLN
INTRAVENOUS | Status: DC | PRN
  Administered 2018-03-31: 30 mg via INTRAVENOUS

## 2018-03-31 MED ORDER — MIDAZOLAM HCL 5 MG/5ML IJ SOLN
INTRAMUSCULAR | Status: DC | PRN
  Administered 2018-03-31: 2 mg via INTRAVENOUS

## 2018-03-31 SURGICAL SUPPLY — 25 items
BATTERY INSTRU NAVIGATION (MISCELLANEOUS) ×6 IMPLANT
CANISTER SUCT 1200ML W/VALVE (MISCELLANEOUS) ×2 IMPLANT
COAG SUCT 10F 3.5MM HAND CTRL (MISCELLANEOUS) ×2 IMPLANT
DRAPE HEAD BAR (DRAPES) ×2 IMPLANT
DRESSING NASL FOAM PST OP SINU (MISCELLANEOUS) IMPLANT
DRSG NASAL 4CM NASOPORE (MISCELLANEOUS) IMPLANT
DRSG NASAL FOAM POST OP SINU (MISCELLANEOUS)
ELECT REM PT RETURN 9FT ADLT (ELECTROSURGICAL) ×2
ELECTRODE REM PT RTRN 9FT ADLT (ELECTROSURGICAL) ×1 IMPLANT
GLOVE BIO SURGEON STRL SZ7.5 (GLOVE) ×4 IMPLANT
IV NS 500ML (IV SOLUTION) ×1
IV NS 500ML BAXH (IV SOLUTION) ×1 IMPLANT
KIT TURNOVER KIT A (KITS) ×2 IMPLANT
NEEDLE SPNL 25GX3.5 QUINCKE BL (NEEDLE) ×2 IMPLANT
NS IRRIG 500ML POUR BTL (IV SOLUTION) ×2 IMPLANT
PACK DRAPE NASAL/ENT (PACKS) ×2 IMPLANT
PACKING NASAL EPIS 4X2.4 XEROG (MISCELLANEOUS) ×4 IMPLANT
PATTIES SURGICAL .5 X3 (DISPOSABLE) ×2 IMPLANT
SHAVER DIEGO BLD STD TYPE A (BLADE) ×2 IMPLANT
SOL ANTI-FOG 6CC FOG-OUT (MISCELLANEOUS) ×1 IMPLANT
SOL FOG-OUT ANTI-FOG 6CC (MISCELLANEOUS) ×1
SYR 10ML LL (SYRINGE) ×2 IMPLANT
TRACKER CRANIALMASK (MASK) ×2 IMPLANT
TUBING DECLOG MULTIDEBRIDER (TUBING) ×2 IMPLANT
WATER STERILE IRR 500ML POUR (IV SOLUTION) IMPLANT

## 2018-03-31 NOTE — Anesthesia Preprocedure Evaluation (Addendum)
Anesthesia Evaluation  Patient identified by MRN, date of birth, ID band Patient awake    Reviewed: Allergy & Precautions, NPO status , Patient's Chart, lab work & pertinent test results  Airway Mallampati: II  TM Distance: >3 FB     Dental  (+) Poor Dentition   Pulmonary neg shortness of breath, neg sleep apnea, neg recent URI, former smoker,    breath sounds clear to auscultation       Cardiovascular (-) anginanegative cardio ROS   Rhythm:Regular Rate:Normal     Neuro/Psych    GI/Hepatic negative GI ROS, Neg liver ROS,   Endo/Other  BMI 35  Renal/GU Hx kidney stones     Musculoskeletal   Abdominal   Peds  Hematology negative hematology ROS (+)   Anesthesia Other Findings Hx prostate cancer  Reproductive/Obstetrics                           Anesthesia Physical Anesthesia Plan  ASA: II  Anesthesia Plan: General   Post-op Pain Management:    Induction:   PONV Risk Score and Plan:   Airway Management Planned: Oral ETT  Additional Equipment:   Intra-op Plan:   Post-operative Plan:   Informed Consent: I have reviewed the patients History and Physical, chart, labs and discussed the procedure including the risks, benefits and alternatives for the proposed anesthesia with the patient or authorized representative who has indicated his/her understanding and acceptance.     Plan Discussed with: CRNA  Anesthesia Plan Comments:         Anesthesia Quick Evaluation

## 2018-03-31 NOTE — Transfer of Care (Signed)
Immediate Anesthesia Transfer of Care Note  Patient: Nathan Holland  Procedure(s) Performed: IMAGE GUIDED SINUS SURGERY (Bilateral Nose) MAXILLARY ANTROSTOMY WITH TISSUE REMOVAL (Bilateral Nose) ETHMOIDECTOMY  WITH FRONTAL EXPLORATION (Bilateral Nose)  Patient Location: PACU  Anesthesia Type: General  Level of Consciousness: awake, alert  and patient cooperative  Airway and Oxygen Therapy: Patient Spontanous Breathing and Patient connected to supplemental oxygen  Post-op Assessment: Post-op Vital signs reviewed, Patient's Cardiovascular Status Stable, Respiratory Function Stable, Patent Airway and No signs of Nausea or vomiting  Post-op Vital Signs: Reviewed and stable  Complications: No apparent anesthesia complications

## 2018-03-31 NOTE — H&P (Signed)
History and physical reviewed and will be scanned in later. No change in medical status reported by the patient or family, appears stable for surgery. All questions regarding the procedure answered, and patient (or family if a child) expressed understanding of the procedure. ? ?Nathan Holland [name] ?@TODAY@ ?

## 2018-03-31 NOTE — Op Note (Signed)
03/31/2018  12:40 PM    Nathan Holland  [id number]   Pre-Op Diagnosis:  CHRONIC SINUSITIS , BILATERAL NASAL POLYPS Post-op Diagnosis: CHRONIC SINUSITIS , BILATERAL NASAL POLYPS  Procedure:  1)  Image Guided Sinus Surgery,   2)  Bilateral Endoscopic Maxillary Antrostomy with Tissue Removal   3)  Bilateral Frontal Sinusotomy   4)  Bilateral Total Ethmoidectomy       Surgeon:  Riley Nearing  Anesthesia:  General endotracheal  EBL:  315VV  Complications:  None  Findings: Extensive bilateral nasal polyps obstructing the frontal, ethmoid and maxillary sinuses  Procedure: After the patient was identified in holding and the benefits of the procedure were reviewed as well as the consent and risks, the patient was taken to the operating room and with the patient in a comfortable supine position,  general orotracheal anesthesia was induced without difficulty.  A proper time-out was performed.  The Stryker image guidance system was set up and calibrated in the normal fashion and felt to be acceptable.  Next 1% Xylocaine with 1:100,000 epinephrine was infiltrated into visible polyps and anterior middle turbinates bilaterally.  Several minutes were allowed for this to take effect.  Cottoniod pledgets soaked in Afrin were placed into both nasal cavities and left while the patient was prepped and draped in the standard fashion. The image guided suction was calibrated and used to inspect known points in the nasal cavity to assess accuracy of the image guided system. Accuracy was felt to be excellent.   The left nasal cavity was inspected with the 0 degree scope and polyps were debrided with forceps and the microdebrider until the middle turbinate and meatus could be visualized. The left middle turbinate was medialized and the uncinate process then resected with through-cutting forceps as well as the microdebrider. In this fashion the uncinate was completely removed along with polypoid soft  tissue and bone of the medial wall of the maxillary sinus to create a large patent maxillary antrostomy. The left maxillary sinus was suctioned to clear secretions.   Next the left anterior ethmoid sinuses were dissected beginning inferomedially, entering the ethmoid bulla. Thru cut forceps were used to open the anterior ethmoids. The microdebrider was used as needed to trim polyps as needed.   Next the basal lamella was entered and, working back in a sequential fashion through the ethmoid air cells, the ethmoid sinuses were dissected to the posterior ethmoid sinuses, utilizing the image guided suction and the whole time to reassess the anatomy frequently. Thru cutting forceps were used for this dissection. Forceps were used to debride polps. Care was taken to avoid injury to the lamina papyracea laterally and the skull base superiorly.   Dissection proceeded anteriorly and superiorly into the left frontal recess which was dissected utilizing a 30 and then a 70 scope and frontal curved instruments. The curved image guided suction was used during this dissection to frequently reassess the anatomy on the CT scan. The frontal recess was dissected, removing polypoid tissue, until a suction could be passed up into the region of the frontal sinus.   Attention was then turned to the right side where the same procedure was performed, opening the maxillary sinus, ethmoid cavities,  and the frontal recess in the same fashion as described above.   The nose was suctioned and inspected. The maxillary sinuses were irrigated with saline. Zerogel absorbable sinus packing was then placed in the ethmoid cavities bilaterally.   The patient was then returned to the anesthesiologist  for awakening and taken to recovery room in good condition postoperatively.  Disposition:   PACU and d/c home  Plan: Ice, elevation, narcotic analgesia, steroids and prophylactic antibiotics. Begin sinus irrigations with saline tomrrow,  irrigating 3-4 times daily. Return to the office in 7 days.  Return to work in 7-10 days, no strenuous activities for two weeks.   Riley Nearing 03/31/2018 12:40 PM

## 2018-03-31 NOTE — Anesthesia Procedure Notes (Signed)
Procedure Name: Intubation Date/Time: 03/31/2018 10:48 AM Performed by: Mayme Genta, CRNA Pre-anesthesia Checklist: Patient identified, Emergency Drugs available, Suction available, Patient being monitored and Timeout performed Patient Re-evaluated:Patient Re-evaluated prior to induction Oxygen Delivery Method: Circle system utilized Preoxygenation: Pre-oxygenation with 100% oxygen Induction Type: IV induction Ventilation: Mask ventilation without difficulty Laryngoscope Size: Miller and 3 Grade View: Grade I Tube type: Oral Rae Tube size: 7.5 mm Number of attempts: 1 Placement Confirmation: ETT inserted through vocal cords under direct vision,  positive ETCO2 and breath sounds checked- equal and bilateral Tube secured with: Tape Dental Injury: Teeth and Oropharynx as per pre-operative assessment

## 2018-03-31 NOTE — Anesthesia Postprocedure Evaluation (Signed)
Anesthesia Post Note  Patient: Nathan Holland  Procedure(s) Performed: IMAGE GUIDED SINUS SURGERY (Bilateral Nose) MAXILLARY ANTROSTOMY WITH TISSUE REMOVAL (Bilateral Nose) ETHMOIDECTOMY  WITH FRONTAL EXPLORATION (Bilateral Nose)  Patient location during evaluation: PACU Anesthesia Type: General Level of consciousness: awake Pain management: pain level controlled Vital Signs Assessment: post-procedure vital signs reviewed and stable Respiratory status: respiratory function stable Cardiovascular status: stable Postop Assessment: no signs of nausea or vomiting Anesthetic complications: no    Veda Canning

## 2018-04-01 ENCOUNTER — Encounter: Payer: Self-pay | Admitting: Otolaryngology

## 2018-04-02 LAB — SURGICAL PATHOLOGY

## 2019-10-05 ENCOUNTER — Ambulatory Visit: Payer: Self-pay | Attending: Internal Medicine

## 2019-10-05 DIAGNOSIS — Z20822 Contact with and (suspected) exposure to covid-19: Secondary | ICD-10-CM

## 2019-10-05 DIAGNOSIS — U071 COVID-19: Secondary | ICD-10-CM | POA: Insufficient documentation

## 2019-10-06 LAB — NOVEL CORONAVIRUS, NAA: SARS-CoV-2, NAA: DETECTED — AB

## 2019-10-07 ENCOUNTER — Telehealth: Payer: Self-pay

## 2019-10-07 ENCOUNTER — Telehealth: Payer: Self-pay | Admitting: Infectious Diseases

## 2019-10-07 NOTE — Telephone Encounter (Signed)
Pt notified of positive COVID-19 test results. Pt verbalized understanding. Pt reports that he some headaches .Pt advised to remain in self quarantine until at least 10 days since symptom onset And 3 consecutive days fever free without antipyretics And improvement in respiratory symptoms. Patient advised to utilize over the counter medications to treat symptoms. Pt advised to seek treatment in the ED if respiratory issues/distress develops.Pt advised they should only leave home to seek and medical care and must wear a mask in public. Pt instructed to limit contact with family members or caregivers in the home. Pt advised to practice social distancing and to continue to use good preventative care measures such has frequent hand washing, staying out of crowds and cleaning hard surfaces frequently touched in the home.Pt informed that the health department will likely follow up and may have additional recommendations. Will notify HD.

## 2019-10-07 NOTE — Telephone Encounter (Signed)
Called to discuss with patient about Covid symptoms and the use of bamlanivimab, a monoclonal antibody infusion for those with mild to moderate Covid symptoms and at a high risk of hospitalization.  Pt is qualified for this infusion at the [facility] infusion center due to Age > 18.    He started showing symptoms Monday 12/28 with body aches, nasal congestion, cough. Feeling a little worse today. He agreed to monoclonal antibody infusion.   His wife is showing symptoms and is 65 yo with HTN on medications. He will take her to get tested today but she started showing symptoms 2 days ago. He would like to plan on having her get the infusion at the same time on Monday 1/04 @ 4:30 p.m.

## 2019-10-08 DIAGNOSIS — C679 Malignant neoplasm of bladder, unspecified: Secondary | ICD-10-CM

## 2019-10-08 HISTORY — DX: Malignant neoplasm of bladder, unspecified: C67.9

## 2019-10-09 ENCOUNTER — Other Ambulatory Visit: Payer: Self-pay | Admitting: Infectious Diseases

## 2019-10-09 DIAGNOSIS — U071 COVID-19: Secondary | ICD-10-CM

## 2019-10-09 NOTE — Progress Notes (Signed)
See previous phone note by me

## 2019-10-11 ENCOUNTER — Ambulatory Visit (HOSPITAL_COMMUNITY)
Admission: RE | Admit: 2019-10-11 | Discharge: 2019-10-11 | Disposition: A | Discharge status: Home / Self Care | Payer: Self-pay | Source: Ambulatory Visit | Attending: Pulmonary Disease | Admitting: Pulmonary Disease

## 2019-10-11 DIAGNOSIS — Z23 Encounter for immunization: Secondary | ICD-10-CM | POA: Insufficient documentation

## 2019-10-11 DIAGNOSIS — U071 COVID-19: Secondary | ICD-10-CM | POA: Insufficient documentation

## 2019-10-11 MED ORDER — ALBUTEROL SULFATE HFA 108 (90 BASE) MCG/ACT IN AERS: 2.0000 | INHALATION_SPRAY | Freq: Once | RESPIRATORY_TRACT | Status: DC | PRN

## 2019-10-11 MED ORDER — EPINEPHRINE 0.3 MG/0.3ML IJ SOAJ: 0.3000 mg | Freq: Once | INTRAMUSCULAR | Status: DC | PRN

## 2019-10-11 MED ORDER — FAMOTIDINE IN NACL 20-0.9 MG/50ML-% IV SOLN: 20.0000 mg | Freq: Once | INTRAVENOUS | Status: DC | PRN

## 2019-10-11 MED ORDER — SODIUM CHLORIDE 0.9 % IV SOLN
INTRAVENOUS | Status: DC | PRN
  Administered 2019-10-11: 250 mL via INTRAVENOUS

## 2019-10-11 MED ORDER — DIPHENHYDRAMINE HCL 50 MG/ML IJ SOLN: 50.0000 mg | Freq: Once | INTRAMUSCULAR | Status: DC | PRN

## 2019-10-11 MED ORDER — METHYLPREDNISOLONE SODIUM SUCC 125 MG IJ SOLR: 125.0000 mg | Freq: Once | INTRAMUSCULAR | Status: DC | PRN

## 2019-10-11 MED ORDER — SODIUM CHLORIDE 0.9 % IV SOLN
700.0000 mg | Freq: Once | INTRAVENOUS | Status: AC
  Administered 2019-10-11: 17:00:00 700 mg via INTRAVENOUS
  Filled 2019-10-11: qty 20

## 2019-10-11 NOTE — Discharge Instructions (Signed)

## 2020-01-12 ENCOUNTER — Encounter: Payer: Self-pay | Admitting: Physician Assistant

## 2020-01-12 ENCOUNTER — Ambulatory Visit (INDEPENDENT_AMBULATORY_CARE_PROVIDER_SITE_OTHER): Payer: Medicare Other | Admitting: Physician Assistant

## 2020-01-12 ENCOUNTER — Other Ambulatory Visit: Payer: Self-pay

## 2020-01-12 VITALS — BP 154/92 | HR 71 | Ht 71.0 in | Wt 250.0 lb

## 2020-01-12 DIAGNOSIS — R338 Other retention of urine: Secondary | ICD-10-CM

## 2020-01-12 DIAGNOSIS — R319 Hematuria, unspecified: Secondary | ICD-10-CM | POA: Diagnosis not present

## 2020-01-12 LAB — MICROSCOPIC EXAMINATION
Bacteria, UA: NONE SEEN
Epithelial Cells (non renal): NONE SEEN /hpf (ref 0–10)
RBC: >30 /hpf — AB (ref 0–2)

## 2020-01-12 LAB — URINALYSIS, COMPLETE
Bilirubin, UA: NEGATIVE
Glucose, UA: NEGATIVE
Ketones, UA: NEGATIVE
Leukocytes,UA: NEGATIVE
Nitrite, UA: NEGATIVE
Protein,UA: NEGATIVE
Specific Gravity, UA: >1.03 — ABNORMAL HIGH (ref 1.005–1.030)
Urobilinogen, Ur: 0.2 mg/dL (ref 0.2–1.0)
pH, UA: 5.5 (ref 5.0–7.5)

## 2020-01-12 LAB — BLADDER SCAN AMB NON-IMAGING: Scan Result: 502

## 2020-01-12 NOTE — Progress Notes (Signed)
Simple Catheter Placement  Due to urinary retention patient is present today for a foley cath placement.  Patient was cleaned and prepped in a sterile fashion with betadine and 2% lidocaine jelly was instilled into the urethra. A 20 FR coud foley catheter was inserted, urine return was noted  453ml, urine was yellow in color.  The balloon was filled with 10cc of sterile water.  A leg bag was attached for drainage. Patient was given instruction on proper catheter care.  Patient tolerated well, no complications were noted   Performed by: Debroah Loop, PA-C and Elberta Leatherwood, [place]

## 2020-01-12 NOTE — Progress Notes (Signed)
01/12/2020 11:27 AM   Nathan Holland November 19, 1955 [id number]  CC: Inability to urinate, gross hematuria  HPI: Nathan Holland is a 64 y.o. male with PMH prostate cancer s/p seed placement and nephrolithiasis who presents today for evaluation of gross hematuria and the inability to urinate.  He was previously seen by Dr. Yves Dill and was scheduled to establish care in our office with Dr. Erlene Quan later this month, however he presents today with symptoms requiring immediate evaluation.  Today, patient reports sudden worsening of urgency, frequency, and the sensation of incomplete voiding x approximately 2 weeks.  He does report weak urinary stream at baseline since undergoing treatment for prostate cancer approximately 10 years ago.  He sought care with his PCP for the symptoms 9 days ago and was started on Flomax and given Cipro for possible UTI.  UA notable for gross hematuria.  Urine culture ultimately resulted negative and he stopped antibiotics.  Patient reports that his urinary symptoms became even worse approximately 5 days ago.  He stopped Flomax at that time with concerns that it was contributing to his symptoms.  He reports not being able to urinate at all since this morning around 0830.  Additionally, he reports passage of [place] colored blood from his penis since this morning.  Patient denies flank or lower abdominal pain associated with the symptoms.  He states that he had does have some lower abdominal pressure.  He reports characteristic flank pain with stone episodes and states that his current symptoms are not consistent with these.  He denies a history of urinary retention.  He does not believe he has been passing blood clots.  In-office UA today positive for 3+ blood; urine microscopy with >30 RBCs/HPF. PVR 513mL.  PMH: Past Medical History:  Diagnosis Date  . Cancer [facility]) 2012   prostate with seed placement  . History of kidney stones 2011,2012   previous  lithotripsies    Surgical History: Past Surgical History:  Procedure Laterality Date  . ETHMOIDECTOMY Bilateral 03/31/2018   Procedure: ETHMOIDECTOMY  WITH FRONTAL EXPLORATION;  Surgeon: Clyde Canterbury, MD;  Location: [place];  Service: ENT;  Laterality: Bilateral;  . EXTRACORPOREAL SHOCK WAVE LITHOTRIPSY  2011,2012  . EXTRACORPOREAL SHOCK WAVE LITHOTRIPSY Left 01/02/2017   Procedure: EXTRACORPOREAL SHOCK WAVE LITHOTRIPSY (ESWL);  Surgeon: Royston Cowper, MD;  Location: [facility];  Service: Urology;  Laterality: Left;  . IMAGE GUIDED SINUS SURGERY Bilateral 03/31/2018   Procedure: IMAGE GUIDED SINUS SURGERY;  Surgeon: Clyde Canterbury, MD;  Location: [place];  Service: ENT;  Laterality: Bilateral;  NEED STRYKER DISK GAVE STRYKER DISK TO CECE 6-11  KP  . MAXILLARY ANTROSTOMY Bilateral 03/31/2018   Procedure: MAXILLARY ANTROSTOMY WITH TISSUE REMOVAL;  Surgeon: Clyde Canterbury, MD;  Location: [place];  Service: ENT;  Laterality: Bilateral;    Home Medications:  Allergies as of 01/12/2020   No Known Allergies     Medication List       Accurate as of January 12, 2020 11:27 AM. If you have any questions, ask your nurse or doctor.        STOP taking these medications   cefdinir 300 MG capsule Commonly known as: OMNICEF Stopped by: Debroah Loop, PA-C   fluticasone 50 MCG/ACT nasal spray Commonly known as: FLONASE Stopped by: Debroah Loop, PA-C   HYDROcodone-acetaminophen 5-325 MG tablet Commonly known as: NORCO/VICODIN Stopped by: Debroah Loop, PA-C   predniSONE 10 MG (48) Tbpk tablet Commonly known as: STERAPRED UNI-PAK 48 TAB  Stopped by: Debroah Loop, PA-C     TAKE these medications   UNABLE TO FIND Therolith vitamin and mineral supplement for Kidney stones.       Allergies:  No Known Allergies  Family History: History reviewed. No pertinent family history.  Social History:   reports that he quit smoking  about 14 years ago. He smoked 1.00 pack per day. He has never used smokeless tobacco. He reports that he does not drink alcohol or use drugs.  Physical Exam: BP (!) 154/92   Pulse 71   Ht 5\' 11"  (1.803 m)   Wt 250 lb (113.4 kg)   BMI 34.87 kg/m   Constitutional:  Alert and oriented, no acute distress, nontoxic appearing HEENT: [place], AT Cardiovascular: No clubbing, cyanosis, or edema Respiratory: Normal respiratory effort, no increased work of breathing GI: Protuberant abdomen GU: Circumcised penis Skin: No rashes, bruises or suspicious lesions Neurologic: Grossly intact, no focal deficits, moving all 4 extremities Psychiatric: Normal mood and affect  Laboratory Data: Results for orders placed or performed in visit on 01/12/20  Microscopic Examination   URINE  Result Value Ref Range   WBC, UA 0-5 0 - 5 /hpf   RBC >30 (A) 0 - 2 /hpf   Epithelial Cells (non renal) None seen 0 - 10 /hpf   Bacteria, UA None seen None seen/Few  Urinalysis, Complete  Result Value Ref Range   Specific Gravity, UA >1.030 (H) 1.005 - 1.030   pH, UA 5.5 5.0 - 7.5   Color, UA Yellow Yellow   Appearance Ur Cloudy (A) Clear   Leukocytes,UA Negative Negative   Protein,UA Negative Negative/Trace   Glucose, UA Negative Negative   Ketones, UA Negative Negative   RBC, UA 3+ (A) Negative   Bilirubin, UA Negative Negative   Urobilinogen, Ur 0.2 0.2 - 1.0 mg/dL   Nitrite, UA Negative Negative   Microscopic Examination See below:   Bladder Scan (Post Void Residual) in office  Result Value Ref Range   Scan Result 502    Assessment & Plan:   1. Acute urinary retention 64 year old male with a history of worsening urgency, frequency, and incomplete emptying presents to establish care in acute urinary retention.  Recommended Foley catheter placement today for bladder decompression with plans for voiding trial in 1 week.  Foley catheter placed today, see separate procedure note for details.   Counseled patient to  restart Flomax. - Bladder Scan (Post Void Residual) in office  2. Hematuria, unspecified type No gross hematuria upon Foley placement today.  Given his reports, I elected to irrigate his bladder.  I instilled 60 mL of sterile water through the catheter into his bladder with return of 60 mL of clear fluid.  No clot material visualized.  Subsequently, there was scant pink-tinged urinary output in his leg bag.  I expect this to be self-limited and secondary to bladder manipulation, no further intervention required.  UA today notable for microscopic hematuria, unclear if this is secondary to acute retention or represents another process.  Recommend repeat UA once he is out of this acute retentive episode to evaluate for resolution. - Urinalysis, Complete  Return in about 1 week (around 01/19/2020) for Voiding trial + 4 week symptom recheck and PVR with Dr. Erlene Quan.  Debroah Loop, PA-C  [facility] [address], [place], [place] [address] [phone]

## 2020-01-12 NOTE — Patient Instructions (Addendum)
Restart Flomax 0.4mg  daily.  Indwelling Urinary Catheter Care, Adult An indwelling urinary catheter is a thin tube that is put into your bladder. The tube helps to drain pee (urine) out of your body. The tube goes in through your urethra. Your urethra is where pee comes out of your body. Your pee will come out through the catheter, then it will go into a bag (drainage bag). Take good care of your catheter so it will work well. How to wear your catheter and bag Supplies needed  Sticky tape (adhesive tape) or a leg strap.  Alcohol wipe or soap and water (if you use tape).  A clean towel (if you use tape).  Large overnight bag.  Smaller bag (leg bag). Wearing your catheter Attach your catheter to your leg with tape or a leg strap.  Make sure the catheter is not pulled tight.  If a leg strap gets wet, take it off and put on a dry strap.  If you use tape to hold the bag on your leg: 1. Use an alcohol wipe or soap and water to wash your skin where the tape made it sticky before. 2. Use a clean towel to pat-dry that skin. 3. Use new tape to make the bag stay on your leg. Wearing your bags You should have been given a large overnight bag.  You may wear the overnight bag in the day or night.  Always have the overnight bag lower than your bladder.  Do not let the bag touch the floor.  Before you go to sleep, put a clean plastic bag in a wastebasket. Then hang the overnight bag inside the wastebasket. You should also have a smaller leg bag that fits under your clothes.  Always wear the leg bag below your knee.  Do not wear your leg bag at night. How to care for your skin and catheter Supplies needed  A clean washcloth.  Water and mild soap.  A clean towel. Caring for your skin and catheter      Clean the skin around your catheter every day: 1. Wash your hands with soap and water. 2. Wet a clean washcloth in warm water and mild soap. 3. Clean the skin around your  urethra.  If you are male:  Gently spread the folds of skin around your vagina (labia).  With the washcloth in your other hand, wipe the inner side of your labia on each side. Wipe from front to back.  If you are male:  Pull back any skin that covers the end of your penis (foreskin).  With the washcloth in your other hand, wipe your penis in small circles. Start wiping at the tip of your penis, then move away from the catheter.  Move the foreskin back in place, if needed. 4. With your free hand, hold the catheter close to where it goes into your body.  Keep holding the catheter during cleaning so it does not get pulled out. 5. With the washcloth in your other hand, clean the catheter.  Only wipe downward on the catheter.  Do not wipe upward toward your body. Doing this may push germs into your urethra and cause infection. 6. Use a clean towel to pat-dry the catheter and the skin around it. Make sure to wipe off all soap. 7. Wash your hands with soap and water.  Shower every day. Do not take baths.  Do not use cream, ointment, or lotion on the area where the catheter goes into your body,  unless your doctor tells you to.  Do not use powders, sprays, or lotions on your genital area.  Check your skin around the catheter every day for signs of infection. Check for: ? Redness, swelling, or pain. ? Fluid or blood. ? Warmth. ? Pus or a bad smell. How to empty the bag Supplies needed  Rubbing alcohol.  Gauze pad or cotton ball.  Tape or a leg strap. Emptying the bag Pour the pee out of your bag when it is ?- full, or at least 2-3 times a day. Do this for your overnight bag and your leg bag. 1. Wash your hands with soap and water. 2. Separate (detach) the bag from your leg. 3. Hold the bag over the toilet or a clean pail. Keep the bag lower than your hips and bladder. This is so the pee (urine) does not go back into the tube. 4. Open the pour spout. It is at the bottom of  the bag. 5. Empty the pee into the toilet or pail. Do not let the pour spout touch any surface. 6. Put rubbing alcohol on a gauze pad or cotton ball. 7. Use the gauze pad or cotton ball to clean the pour spout. 8. Close the pour spout. 9. Attach the bag to your leg with tape or a leg strap. 10. Wash your hands with soap and water. Follow instructions for cleaning the drainage bag:  From the product maker.  As told by your doctor. How to change the bag Supplies needed  Alcohol wipes.  A clean bag.  Tape or a leg strap. Changing the bag Replace your bag when it starts to leak, smell bad, or look dirty. 1. Wash your hands with soap and water. 2. Separate the dirty bag from your leg. 3. Pinch the catheter with your fingers so that pee does not spill out. 4. Separate the catheter tube from the bag tube where these tubes connect (at the connection valve). Do not let the tubes touch any surface. 5. Clean the end of the catheter tube with an alcohol wipe. Use a different alcohol wipe to clean the end of the bag tube. 6. Connect the catheter tube to the tube of the clean bag. 7. Attach the clean bag to your leg with tape or a leg strap. Do not make the bag tight on your leg. 8. Wash your hands with soap and water. General rules   Never pull on your catheter. Never try to take it out. Doing that can hurt you.  Always wash your hands before and after you touch your catheter or bag. Use a mild, fragrance-free soap. If you do not have soap and water, use hand sanitizer.  Always make sure there are no twists or bends (kinks) in the catheter tube.  Always make sure there are no leaks in the catheter or bag.  Drink enough fluid to keep your pee pale yellow.  Do not take baths, swim, or use a hot tub.  If you are male, wipe from front to back after you poop (have a bowel movement). Contact a doctor if:  Your pee is cloudy.  Your pee smells worse than usual.  Your catheter gets  clogged.  Your catheter leaks.  Your bladder feels full. Get help right away if:  You have redness, swelling, or pain where the catheter goes into your body.  You have fluid, blood, pus, or a bad smell coming from the area where the catheter goes into your body.  Your skin feels warm where the catheter goes into your body.  You have a fever.  You have pain in your: ? Belly (abdomen). ? Legs. ? Lower back. ? Bladder.  You see blood in the catheter.  Your pee is pink or red.  You feel sick to your stomach (nauseous).  You throw up (vomit).  You have chills.  Your pee is not draining into the bag.  Your catheter gets pulled out. Summary  An indwelling urinary catheter is a thin tube that is placed into the bladder to help drain pee (urine) out of the body.  The catheter is placed into the part of the body that drains pee from the bladder (urethra).  Taking good care of your catheter will keep it working properly and help prevent problems.  Always wash your hands before and after touching your catheter or bag.  Never pull on your catheter or try to take it out. This information is not intended to replace advice given to you by your health care provider. Make sure you discuss any questions you have with your health care provider. Document Revised: 01/15/2019 Document Reviewed: 05/09/2017 Elsevier Patient Education  [employer].

## 2020-01-13 ENCOUNTER — Telehealth: Payer: Self-pay | Admitting: *Deleted

## 2020-01-13 ENCOUNTER — Encounter: Payer: Self-pay | Admitting: Urology

## 2020-01-13 ENCOUNTER — Ambulatory Visit (INDEPENDENT_AMBULATORY_CARE_PROVIDER_SITE_OTHER): Payer: Medicare Other | Admitting: Urology

## 2020-01-13 VITALS — BP 152/89 | HR 88 | Ht 71.0 in | Wt 250.0 lb

## 2020-01-13 DIAGNOSIS — R310 Gross hematuria: Secondary | ICD-10-CM

## 2020-01-13 DIAGNOSIS — R338 Other retention of urine: Secondary | ICD-10-CM | POA: Diagnosis not present

## 2020-01-13 MED ORDER — OXYBUTYNIN CHLORIDE 5 MG PO TABS: 5.0000 mg | ORAL_TABLET | Freq: Three times a day (TID) | ORAL | 0 refills | Status: DC

## 2020-01-13 NOTE — Progress Notes (Signed)
01/13/2020 5:22 PM   [employer] 1955/11/18 [id number]  CC: Inability to urinate, gross hematuria  HPI: Nathan Holland is a 64 y.o. male with PMH prostate cancer s/p seed placement and nephrolithiasis who presents today after contacting the after hours call service for dark red urine in the catheter bag, diarrhea and pain in his penis.  He presented to the office yesterday for evaluation of gross hematuria and the inability to urinate and was seen by Thomas Hoff, PA-C.  He was previously seen by Dr. Yves Dill and was scheduled to establish care in our office with Dr. Erlene Quan later this month, however he presented yesterday with symptoms requiring immediate evaluation.  He does reported a weak urinary stream at baseline since undergoing treatment for prostate cancer approximately 10 years ago.  He sought care with his PCP for the symptoms 9 days ago and was started on Flomax and given Cipro for possible UTI.  UA notable for gross hematuria.  Urine culture ultimately resulted negative and he stopped antibiotics.  In-office UA yesterday was positive for 3+ blood; urine microscopy with >30 RBCs/HPF. PVR 549mL.   A Foley catheter was placed and the urine was yellow in color.    Today, he presented to the front desk requesting an appointment.  His son had reached out to the on call service for the complaint of dark red urine in the catheter bag, penile pain and diarrhea.  He states that when the catheter moves, he feels pain in his penis.  The urine in the leg bag is dark pink without clot.  Patient denies any modifying or aggravating factors.  Patient denies any suprapubic/flank pain.  Patient denies any fevers, chills, nausea or vomiting.   PMH: Past Medical History:  Diagnosis Date  . Cancer [facility]) 2012   prostate with seed placement  . History of kidney stones 2011,2012   previous lithotripsies    Surgical History: Past Surgical History:  Procedure Laterality Date  .  ETHMOIDECTOMY Bilateral 03/31/2018   Procedure: ETHMOIDECTOMY  WITH FRONTAL EXPLORATION;  Surgeon: Clyde Canterbury, MD;  Location: [place];  Service: ENT;  Laterality: Bilateral;  . EXTRACORPOREAL SHOCK WAVE LITHOTRIPSY  2011,2012  . EXTRACORPOREAL SHOCK WAVE LITHOTRIPSY Left 01/02/2017   Procedure: EXTRACORPOREAL SHOCK WAVE LITHOTRIPSY (ESWL);  Surgeon: Royston Cowper, MD;  Location: [facility];  Service: Urology;  Laterality: Left;  . IMAGE GUIDED SINUS SURGERY Bilateral 03/31/2018   Procedure: IMAGE GUIDED SINUS SURGERY;  Surgeon: Clyde Canterbury, MD;  Location: [place];  Service: ENT;  Laterality: Bilateral;  NEED STRYKER DISK GAVE STRYKER DISK TO CECE 6-11  KP  . MAXILLARY ANTROSTOMY Bilateral 03/31/2018   Procedure: MAXILLARY ANTROSTOMY WITH TISSUE REMOVAL;  Surgeon: Clyde Canterbury, MD;  Location: [place];  Service: ENT;  Laterality: Bilateral;    Home Medications:  Allergies as of 01/13/2020   No Known Allergies     Medication List       Accurate as of January 13, 2020 11:59 PM. If you have any questions, ask your nurse or doctor.        oxybutynin 5 MG tablet Commonly known as: DITROPAN Take 1 tablet (5 mg total) by mouth 3 (three) times daily. Started by: Zara Council, PA-C   tamsulosin 0.4 MG Caps capsule Commonly known as: FLOMAX Take 0.4 mg by mouth.   UNABLE TO FIND Therolith vitamin and mineral supplement for Kidney stones.       Allergies:  No Known Allergies  Family  History: History reviewed. No pertinent family history.  Social History:   reports that he quit smoking about 14 years ago. He smoked 1.00 pack per day. He has never used smokeless tobacco. He reports that he does not drink alcohol or use drugs.  Physical Exam: BP (!) 152/89   Pulse 88   Ht 5\' 11"  (1.803 m)   Wt 250 lb (113.4 kg)   BMI 34.87 kg/m   Constitutional:  Well nourished. Alert and oriented, No acute distress. HEENT: [place] AT, mask in place.  Trachea  midline. Cardiovascular: No clubbing, cyanosis, or edema. Respiratory: Normal respiratory effort, no increased work of breathing. GI: Abdomen is soft, non tender, non distended, no abdominal masses.  GU: No CVA tenderness.  No bladder fullness or masses.  Patient with circumcised phallus.   Foley in place, taped to his right leg.  No penile discharge. No penile lesions or rashes. Scrotum without lesions, cysts, rashes and/or edema.  Testicles are located scrotally bilaterally. No masses are appreciated in the testicles. Left and right epididymis are normal. Neurologic: Grossly intact, no focal deficits, moving all 4 extremities. Psychiatric: Normal mood and affect.  Laboratory Data: Results for orders placed or performed in visit on 01/12/20  Microscopic Examination   URINE  Result Value Ref Range   WBC, UA 0-5 0 - 5 /hpf   RBC >30 (A) 0 - 2 /hpf   Epithelial Cells (non renal) None seen 0 - 10 /hpf   Bacteria, UA None seen None seen/Few  Urinalysis, Complete  Result Value Ref Range   Specific Gravity, UA >1.030 (H) 1.005 - 1.030   pH, UA 5.5 5.0 - 7.5   Color, UA Yellow Yellow   Appearance Ur Cloudy (A) Clear   Leukocytes,UA Negative Negative   Protein,UA Negative Negative/Trace   Glucose, UA Negative Negative   Ketones, UA Negative Negative   RBC, UA 3+ (A) Negative   Bilirubin, UA Negative Negative   Urobilinogen, Ur 0.2 0.2 - 1.0 mg/dL   Nitrite, UA Negative Negative   Microscopic Examination See below:   Bladder Scan (Post Void Residual) in office  Result Value Ref Range   Scan Result 502   I have reviewed the labs.  Bladder Irrigation Due to hematuria patient is present today for a bladder irrigation. Patient was cleaned and prepped in a sterile fashion. 70 ml of saline/sterile water was instilled and irrigated into the bladder with a 56ml [place] syringe through the catheter in place.  Irrigation returned clear without clots.  After irrigation urine flow was noted no  complications were noted catheter is now draining fine.  Catheter was reattached to the leg bag for drainage. Patient tolerated well.   Performed by: Zara Council, PA-C   Assessment & Plan:    1. Acute urinary retention Continue Flomax Secured catheter to prevent rubbing of the urethral Return as schedule for TOV  2. Gross hematuria  Reassured the patient Advised to contact office if he should develop clots  Will continue to monitor to ensure it resolves after retention episode   Return for Keep follow up with Sam on 01/20/2020 .  Zara Council, PA-C  [facility] [address] [place], [place] [address] [phone]

## 2020-01-13 NOTE — Telephone Encounter (Signed)
Left VM to return call, called triage line last night.

## 2020-01-14 ENCOUNTER — Other Ambulatory Visit: Payer: Self-pay

## 2020-01-14 ENCOUNTER — Emergency Department
Admission: EM | Admit: 2020-01-14 | Discharge: 2020-01-14 | Disposition: A | Discharge status: Home / Self Care | Payer: Medicare Other | Attending: Emergency Medicine | Admitting: Emergency Medicine

## 2020-01-14 ENCOUNTER — Encounter: Payer: Self-pay | Admitting: Emergency Medicine

## 2020-01-14 DIAGNOSIS — C61 Malignant neoplasm of prostate: Secondary | ICD-10-CM | POA: Diagnosis not present

## 2020-01-14 DIAGNOSIS — Y732 Prosthetic and other implants, materials and accessory gastroenterology and urology devices associated with adverse incidents: Secondary | ICD-10-CM | POA: Diagnosis not present

## 2020-01-14 DIAGNOSIS — Z87891 Personal history of nicotine dependence: Secondary | ICD-10-CM | POA: Insufficient documentation

## 2020-01-14 DIAGNOSIS — Z79899 Other long term (current) drug therapy: Secondary | ICD-10-CM | POA: Insufficient documentation

## 2020-01-14 DIAGNOSIS — T83098A Other mechanical complication of other indwelling urethral catheter, initial encounter: Secondary | ICD-10-CM | POA: Insufficient documentation

## 2020-01-14 DIAGNOSIS — T859XXA Unspecified complication of internal prosthetic device, implant and graft, initial encounter: Secondary | ICD-10-CM

## 2020-01-14 LAB — URINALYSIS, COMPLETE (UACMP) WITH MICROSCOPIC
Bacteria, UA: NONE SEEN
Bilirubin Urine: NEGATIVE
Glucose, UA: NEGATIVE mg/dL
Ketones, ur: NEGATIVE mg/dL
Nitrite: NEGATIVE
Protein, ur: 100 mg/dL — AB
RBC / HPF: >50 RBC/hpf — ABNORMAL HIGH (ref 0–5)
Specific Gravity, Urine: 1.021 (ref 1.005–1.030)
Squamous Epithelial / LPF: NONE SEEN (ref 0–5)
pH: 6 (ref 5.0–8.0)

## 2020-01-14 NOTE — ED Provider Notes (Signed)
[facility] Emergency Department Provider Note  ____________________________________________  Time seen: Approximately 10:19 PM  I have reviewed the triage vital signs and the nursing notes.   HISTORY  Chief Complaint Catheter leaking    HPI Nathan Holland is a 64 y.o. male with past medical history of prostate cancer and kidney stones that presents to the emergency department for evaluation of leaking surrounding urinary catheter today.  Patient was seen by primary care earlier this week for urinary retention and hematuria.  He was referred to urology.  He followed up with urology yesterday and had catheter placement.  Patient is supposed to follow-up again with urology next week for catheter removal.  He went to work today and may have been rough with the catheter.  He had some leaking surrounding the opening.  He does have some pain to the tip of his penis around the catheter.  He discussed this with urology yesterday.  No abdominal pain, back pain.  Past Medical History:  Diagnosis Date  . Cancer [facility]) 2012   prostate with seed placement  . History of kidney stones 2011,2012   previous lithotripsies    There are no problems to display for this patient.   Past Surgical History:  Procedure Laterality Date  . ETHMOIDECTOMY Bilateral 03/31/2018   Procedure: ETHMOIDECTOMY  WITH FRONTAL EXPLORATION;  Surgeon: Clyde Canterbury, MD;  Location: [place];  Service: ENT;  Laterality: Bilateral;  . EXTRACORPOREAL SHOCK WAVE LITHOTRIPSY  2011,2012  . EXTRACORPOREAL SHOCK WAVE LITHOTRIPSY Left 01/02/2017   Procedure: EXTRACORPOREAL SHOCK WAVE LITHOTRIPSY (ESWL);  Surgeon: Royston Cowper, MD;  Location: [facility];  Service: Urology;  Laterality: Left;  . IMAGE GUIDED SINUS SURGERY Bilateral 03/31/2018   Procedure: IMAGE GUIDED SINUS SURGERY;  Surgeon: Clyde Canterbury, MD;  Location: [place];  Service: ENT;  Laterality: Bilateral;  NEED STRYKER  DISK GAVE STRYKER DISK TO CECE 6-11  KP  . MAXILLARY ANTROSTOMY Bilateral 03/31/2018   Procedure: MAXILLARY ANTROSTOMY WITH TISSUE REMOVAL;  Surgeon: Clyde Canterbury, MD;  Location: [place];  Service: ENT;  Laterality: Bilateral;    Prior to Admission medications   Medication Sig Start Date End Date Taking? Authorizing Provider  oxybutynin (DITROPAN) 5 MG tablet Take 1 tablet (5 mg total) by mouth 3 (three) times daily. 01/13/20   Zara Council A, PA-C  tamsulosin (FLOMAX) 0.4 MG CAPS capsule Take 0.4 mg by mouth.    [provider]  UNABLE TO FIND Therolith vitamin and mineral supplement for Kidney stones.    [provider]    Allergies Patient has no known allergies.  No family history on file.  Social History Social History   Tobacco Use  . Smoking status: Former Smoker    Packs/day: 1.00    Quit date: 11/06/2005    Years since quitting: 14.1  . Smokeless tobacco: Never Used  Substance Use Topics  . Alcohol use: No    Comment: quit 10/05/2002  . Drug use: No     Review of Systems  Constitutional: No fever/chills Respiratory:  No SOB. Gastrointestinal: Negative for abdominal pain.  No nausea, no vomiting.  Musculoskeletal: Negative for musculoskeletal pain. Skin: Negative for rash, abrasions, lacerations, ecchymosis.   ____________________________________________   PHYSICAL EXAM:  VITAL SIGNS: ED Triage Vitals  Enc Vitals Group     BP 01/14/20 1608 (!) 149/98     Pulse Rate 01/14/20 1607 73     Resp 01/14/20 1607 (!) 116  Temp 01/14/20 1607 (!) 97.5 F (36.4 C)     Temp Source 01/14/20 1607 Oral     SpO2 01/14/20 1607 96 %     Weight 01/14/20 1608 249 lb 1.9 oz (113 kg)     Height 01/14/20 1608 5\' 11"  (1.803 m)     Head Circumference --      Peak Flow --      Pain Score 01/14/20 1608 0     Pain Loc --      Pain Edu? --      Excl. in [place]? --      Constitutional: Alert and oriented. Well appearing and in no acute  distress. Eyes: Conjunctivae are normal. PERRL. EOMI. Head: Atraumatic. ENT:      Ears:      Nose: No congestion/rhinnorhea.      Mouth/Throat: Mucous membranes are moist.  Neck: No stridor.  Cardiovascular: Normal rate, regular rhythm.  Good peripheral circulation. Respiratory: Normal respiratory effort without tachypnea or retractions. Lungs CTAB. Good air entry to the bases with no decreased or absent breath sounds. Gastrointestinal: Abdomen soft and nontender to palpation. Musculoskeletal: Full range of motion to all extremities. No gross deformities appreciated. Neurologic:  Normal speech and language. No gross focal neurologic deficits are appreciated.  Skin:  Skin is warm, dry and intact. No rash noted. Psychiatric: Mood and affect are normal. Speech and behavior are normal. Patient exhibits appropriate insight and judgement.   ____________________________________________   LABS (all labs ordered are listed, but only abnormal results are displayed)  Labs Reviewed  URINALYSIS, COMPLETE (UACMP) WITH MICROSCOPIC - Abnormal; Notable for the following components:      Result Value   Color, Urine AMBER (*)    APPearance CLOUDY (*)    Hgb urine dipstick LARGE (*)    Protein, ur 100 (*)    Leukocytes,Ua TRACE (*)    RBC / HPF >50 (*)    All other components within normal limits  URINE CULTURE   ____________________________________________  EKG   ____________________________________________  RADIOLOGY   No results found.  ____________________________________________    PROCEDURES  Procedure(s) performed:    Procedures    Medications - No data to display   ____________________________________________   INITIAL IMPRESSION / ASSESSMENT AND PLAN / ED COURSE  Pertinent labs & imaging results that were available during my care of the patient were reviewed by me and considered in my medical decision making (see chart for details).  Review of the [place] CSRS was  performed in accordance of the [place] prior to dispensing any controlled drugs.    Patient presented to the emergency department for evaluation of leaking surrounding urinary catheter.  Vital signs and exam are reassuring.  Patient is following with urology for urinary retention and hematuria.  He has a follow-up appointment with them next week.  Urinary catheter was changed without any further leaking.  Urinalysis shows hemoglobin, red blood cells, leukocytes and calcium oxalate crystals.  There is no bacteria.  Urine was sent for culture.  We discussed the possibility of nephrolithiasis and discussed obtaining CT scan imaging.  Patient denies any abdominal pain or back pain.  He is seeing urology for his hematuria.  He declines having a CT scan done at this time.  He will continue to follow-up with urology next week.  He will return to the emergency department if he develops any pain.  Patient is to follow up with primary care as directed. Patient is given ED precautions to return to  the ED for any worsening or new symptoms.   Kelly Margulis Cubit was evaluated in Emergency Department on 01/14/2020 for the symptoms described in the history of present illness. He was evaluated in the context of the global COVID-19 pandemic, which necessitated consideration that the patient might be at risk for infection with the SARS-CoV-2 virus that causes COVID-19. Institutional protocols and algorithms that pertain to the evaluation of patients at risk for COVID-19 are in a state of rapid change based on information released by regulatory bodies including the CDC and federal and state organizations. These policies and algorithms were followed during the patient's care in the ED.  ____________________________________________  FINAL CLINICAL IMPRESSION(S) / ED DIAGNOSES  Final diagnoses:  Complication of catheter, initial encounter      NEW MEDICATIONS STARTED DURING THIS VISIT:  ED Discharge Orders    None           This chart was dictated using voice recognition software/Dragon. Despite best efforts to proofread, errors can occur which can change the meaning. Any change was purely unintentional.    Laban Emperor, PA-C 01/14/20 2223    Duffy Bruce, MD 01/16/20 [phone]

## 2020-01-14 NOTE — ED Triage Notes (Signed)
Pt to ED via POV stating that he had a foley inserted at urologist office on Wednesday. Pt states that his catheter is leaking. Pt is in NAD.

## 2020-01-14 NOTE — ED Notes (Signed)
Pt's 20 Fr coude catheter removed by this RN

## 2020-01-16 LAB — URINE CULTURE: Culture: NO GROWTH

## 2020-01-17 ENCOUNTER — Emergency Department
Admission: EM | Admit: 2020-01-17 | Discharge: 2020-01-17 | Disposition: A | Discharge status: Home / Self Care | Payer: Medicare Other | Attending: Emergency Medicine | Admitting: Emergency Medicine

## 2020-01-17 ENCOUNTER — Other Ambulatory Visit: Payer: Self-pay

## 2020-01-17 DIAGNOSIS — Z79899 Other long term (current) drug therapy: Secondary | ICD-10-CM | POA: Diagnosis not present

## 2020-01-17 DIAGNOSIS — T83091A Other mechanical complication of indwelling urethral catheter, initial encounter: Secondary | ICD-10-CM | POA: Insufficient documentation

## 2020-01-17 DIAGNOSIS — R319 Hematuria, unspecified: Secondary | ICD-10-CM | POA: Diagnosis not present

## 2020-01-17 DIAGNOSIS — Z8546 Personal history of malignant neoplasm of prostate: Secondary | ICD-10-CM | POA: Insufficient documentation

## 2020-01-17 DIAGNOSIS — Z466 Encounter for fitting and adjustment of urinary device: Secondary | ICD-10-CM | POA: Diagnosis present

## 2020-01-17 DIAGNOSIS — T839XXA Unspecified complication of genitourinary prosthetic device, implant and graft, initial encounter: Secondary | ICD-10-CM

## 2020-01-17 NOTE — ED Triage Notes (Signed)
Patient reports leaking around catheter.  Catheter originally placed Wednesday and changed on Friday.

## 2020-01-17 NOTE — ED Notes (Addendum)
Pt's 20 Fr coude catheter removed at this time.

## 2020-01-17 NOTE — ED Provider Notes (Signed)
[facility] Emergency Department Provider Note  ____________________________________________  Time seen: Approximately 5:48 AM  I have reviewed the triage vital signs and the nursing notes.   HISTORY  Chief Complaint Cath problems   HPI Nathan Holland is a 64 y.o. male with a history of prostate cancer status post seed placement in 2012, kidney stones who presents for evaluation of leakage of Foley catheter.  Patient had a Foley catheter placed last week for hematuria and urinary retention.  Had the catheter exchange a few days later due to leakage around it.  Today started having leaking around the catheter as well.  He has been taking Flomax.  Patient has never had a Foley catheter in the past.  He denies penile pain, dysuria, abdominal pain, flank pain, fever, chills, nausea, vomiting.  He continues to have mild hematuria but that is significantly improved from when he first got the catheter.   Past Medical History:  Diagnosis Date  . Cancer [facility]) 2012   prostate with seed placement  . History of kidney stones 2011,2012   previous lithotripsies    There are no problems to display for this patient.   Past Surgical History:  Procedure Laterality Date  . ETHMOIDECTOMY Bilateral 03/31/2018   Procedure: ETHMOIDECTOMY  WITH FRONTAL EXPLORATION;  Surgeon: Clyde Canterbury, MD;  Location: [place];  Service: ENT;  Laterality: Bilateral;  . EXTRACORPOREAL SHOCK WAVE LITHOTRIPSY  2011,2012  . EXTRACORPOREAL SHOCK WAVE LITHOTRIPSY Left 01/02/2017   Procedure: EXTRACORPOREAL SHOCK WAVE LITHOTRIPSY (ESWL);  Surgeon: Royston Cowper, MD;  Location: [facility];  Service: Urology;  Laterality: Left;  . IMAGE GUIDED SINUS SURGERY Bilateral 03/31/2018   Procedure: IMAGE GUIDED SINUS SURGERY;  Surgeon: Clyde Canterbury, MD;  Location: [place];  Service: ENT;  Laterality: Bilateral;  NEED STRYKER DISK GAVE STRYKER DISK TO CECE 6-11  KP  . MAXILLARY  ANTROSTOMY Bilateral 03/31/2018   Procedure: MAXILLARY ANTROSTOMY WITH TISSUE REMOVAL;  Surgeon: Clyde Canterbury, MD;  Location: [place];  Service: ENT;  Laterality: Bilateral;    Prior to Admission medications   Medication Sig Start Date End Date Taking? Authorizing Provider  oxybutynin (DITROPAN) 5 MG tablet Take 1 tablet (5 mg total) by mouth 3 (three) times daily. 01/13/20   Zara Council A, PA-C  tamsulosin (FLOMAX) 0.4 MG CAPS capsule Take 0.4 mg by mouth.    [provider]  UNABLE TO FIND Therolith vitamin and mineral supplement for Kidney stones.    [provider]    Allergies Patient has no known allergies.  No family history on file.  Social History Social History   Tobacco Use  . Smoking status: Former Smoker    Packs/day: 1.00    Quit date: 11/06/2005    Years since quitting: 14.2  . Smokeless tobacco: Never Used  Substance Use Topics  . Alcohol use: No    Comment: quit 10/05/2002  . Drug use: No    Review of Systems  Constitutional: Negative for fever. Eyes: Negative for visual changes. ENT: Negative for sore throat. Neck: No neck pain  Cardiovascular: Negative for chest pain. Respiratory: Negative for shortness of breath. Gastrointestinal: Negative for abdominal pain, vomiting or diarrhea. Genitourinary: Negative for dysuria. Musculoskeletal: Negative for back pain. Skin: Negative for rash. Neurological: Negative for headaches, weakness or numbness. Psych: No SI or HI  ____________________________________________   PHYSICAL EXAM:  VITAL SIGNS: ED Triage Vitals  Enc Vitals Group     BP 01/17/20 0217 Marland Kitchen)  121/54     Pulse Rate 01/17/20 0217 96     Resp 01/17/20 0217 18     Temp 01/17/20 0217 97.9 F (36.6 C)     Temp Source 01/17/20 0217 Oral     SpO2 01/17/20 0217 96 %     Weight 01/17/20 0218 250 lb (113.4 kg)     Height 01/17/20 0218 5\' 11"  (1.803 m)     Head Circumference --      Peak Flow --      Pain Score  01/17/20 0218 0     Pain Loc --      Pain Edu? --      Excl. in [place]? --     Constitutional: Alert and oriented. Well appearing and in no apparent distress. HEENT:      Head: Normocephalic and atraumatic.         Eyes: Conjunctivae are normal. Sclera is non-icteric.       Mouth/Throat: Mucous membranes are moist.       Neck: Supple with no signs of meningismus. Cardiovascular: Regular rate and rhythm.  Respiratory: Normal respiratory effort.  Gastrointestinal: Soft, non tender, and non distended  Genitourinary: 20Fr coud catheter in place with pinkish urine in the bag Musculoskeletal: No edema, cyanosis, or erythema of extremities. Neurologic: Normal speech and language. Face is symmetric. Moving all extremities. No gross focal neurologic deficits are appreciated. Skin: Skin is warm, dry and intact. No rash noted. Psychiatric: Mood and affect are normal. Speech and behavior are normal.  ____________________________________________   LABS (all labs ordered are listed, but only abnormal results are displayed)  Labs Reviewed - No data to display ____________________________________________  EKG  none  ____________________________________________  RADIOLOGY  none  ____________________________________________   PROCEDURES  Procedure(s) performed: None Procedures Critical Care performed:  None ____________________________________________   INITIAL IMPRESSION / ASSESSMENT AND PLAN / ED COURSE   64 y.o. male with a history of prostate cancer status post seed placement in 2012, kidney stones who presents for evaluation of leakage of Foley catheter.  This a temporary catheter that was placed last week due to hematuria and urinary retention.  Patient does not need an indwelling Foley catheter. Second presentation since its insertion for leakage.  Hematuria is clearing up, no clots. He has been on Flomax.  I removed the catheter and patient was able to pass trial of void.  Will  discharge home, continue Flomax, follow-up with urology.  Discussed return precautions for recurrence of his urinary retention.  Old medical records reviewed.     _____________________________________________ Please note:  Patient was evaluated in Emergency Department today for the symptoms described in the history of present illness. Patient was evaluated in the context of the global COVID-19 pandemic, which necessitated consideration that the patient might be at risk for infection with the SARS-CoV-2 virus that causes COVID-19. Institutional protocols and algorithms that pertain to the evaluation of patients at risk for COVID-19 are in a state of rapid change based on information released by regulatory bodies including the CDC and federal and state organizations. These policies and algorithms were followed during the patient's care in the ED.  Some ED evaluations and interventions may be delayed as a result of limited staffing during the pandemic.   [place] Controlled Substance Database was reviewed by me. ____________________________________________   FINAL CLINICAL IMPRESSION(S) / ED DIAGNOSES   Final diagnoses:  Foley catheter problem, initial encounter ([place])      NEW MEDICATIONS STARTED DURING THIS VISIT:  ED  Discharge Orders    None       Note:  This document was prepared using Dragon voice recognition software and may include unintentional dictation errors.    Alfred Levins, [place], MD 01/17/20 [phone]

## 2020-01-17 NOTE — ED Notes (Signed)
Pt attempting to void, unsuccessful at this time. Dr Alfred Levins aware.

## 2020-01-17 NOTE — ED Notes (Signed)
Pt attempting to void, unsuccessful at this time.

## 2020-01-17 NOTE — Discharge Instructions (Addendum)
Return to the emergency room if you are unable to urinate, if you develop abdominal pain, fever, nausea or vomiting.  Otherwise follow-up with your urologist in 2 days.  Continue to take the Flomax daily.

## 2020-01-20 ENCOUNTER — Other Ambulatory Visit: Payer: Self-pay

## 2020-01-20 ENCOUNTER — Ambulatory Visit (INDEPENDENT_AMBULATORY_CARE_PROVIDER_SITE_OTHER): Payer: Medicare Other | Admitting: Physician Assistant

## 2020-01-20 ENCOUNTER — Ambulatory Visit: Payer: Self-pay | Admitting: Physician Assistant

## 2020-01-20 VITALS — BP 124/67 | HR 42 | Ht 71.0 in | Wt 250.0 lb

## 2020-01-20 DIAGNOSIS — R338 Other retention of urine: Secondary | ICD-10-CM | POA: Diagnosis not present

## 2020-01-20 DIAGNOSIS — R310 Gross hematuria: Secondary | ICD-10-CM

## 2020-01-20 LAB — URINALYSIS, COMPLETE
Bilirubin, UA: NEGATIVE
Glucose, UA: NEGATIVE
Ketones, UA: NEGATIVE
Nitrite, UA: NEGATIVE
Specific Gravity, UA: 1.025 (ref 1.005–1.030)
Urobilinogen, Ur: 0.2 mg/dL (ref 0.2–1.0)
pH, UA: 6 (ref 5.0–7.5)

## 2020-01-20 LAB — MICROSCOPIC EXAMINATION: RBC, Urine: >30 /hpf — AB (ref 0–2)

## 2020-01-20 LAB — BLADDER SCAN AMB NON-IMAGING: Scan Result: 68

## 2020-01-20 NOTE — Patient Instructions (Signed)
Continue Flomax.  I will call you if I need to treat you with antibiotics in advance of your CT scan next month.  Contact our office or proceed to the emergency room immediately if you develop the inability to urinate, lower abdominal pain, or abdominal distention.

## 2020-01-20 NOTE — Progress Notes (Signed)
01/20/2020 11:51 AM   Nathan Holland 19-Jul-1956 [id number]  CC: Urinary retention follow-up, gross hematuria  HPI: Nathan Holland is a 64 y.o. male with PMH nephrolithiasis and prostate cancer s/p brachytherapy in 2012 who presents today for follow-up of urinary retention and gross hematuria.  He presented to clinic on 01/12/2020 with acute urinary retention.  At that time, he was awaiting his first appointment in our clinic to establish care for evaluation of gross hematuria.  Foley catheter was placed and patient was counseled to continue Flomax.  He has presented to clinic and the emergency department several times in the interim with reports of catheter problems including Foley catheter leakage.  Foley catheter was removed in the emergency department on 01/17/2020 and he was able to pass a trial of void at that time.  He denies symptoms of urinary retention including lower abdominal pain and the inability to urinate since.  He continues to take Flomax daily and has stopped taking oxybutynin for management of bladder spasm.  Patient reports continued intermittent gross hematuria.  He has not had any pain associated with this.  He believes the blood may be associated with a stone.  In-office UA today positive for 3+ blood, 1+ protein, and trace leukocyte esterase; urine microscopy with >30 RBCs/HPF, and calcium oxalate crystals. PVR 63m.  PMH: Past Medical History:  Diagnosis Date  . Cancer ([facility] 2012   prostate with seed placement  . History of kidney stones 2011,2012   previous lithotripsies    Surgical History: Past Surgical History:  Procedure Laterality Date  . ETHMOIDECTOMY Bilateral 03/31/2018   Procedure: ETHMOIDECTOMY  WITH FRONTAL EXPLORATION;  Surgeon: BClyde Canterbury MD;  [place]  Service: ENT;  Laterality: Bilateral;  . EXTRACORPOREAL SHOCK WAVE LITHOTRIPSY  2011,2012  . EXTRACORPOREAL SHOCK WAVE LITHOTRIPSY Left 01/02/2017   Procedure:  EXTRACORPOREAL SHOCK WAVE LITHOTRIPSY (ESWL);  Surgeon: MRoyston Cowper MD;  Location: [facility];  Service: Urology;  Laterality: Left;  . IMAGE GUIDED SINUS SURGERY Bilateral 03/31/2018   Procedure: IMAGE GUIDED SINUS SURGERY;  Surgeon: BClyde Canterbury MD;  [place]  Service: ENT;  Laterality: Bilateral;  NEED STRYKER DISK GAVE STRYKER DISK TO CECE 6-11  KP  . MAXILLARY ANTROSTOMY Bilateral 03/31/2018   Procedure: MAXILLARY ANTROSTOMY WITH TISSUE REMOVAL;  Surgeon: BClyde Canterbury MD;  [place]  Service: ENT;  Laterality: Bilateral;    Home Medications:  Allergies as of 01/20/2020   No Known Allergies     Medication List       Accurate as of January 20, 2020 11:51 AM. If you have any questions, ask your nurse or doctor.        ciprofloxacin 500 MG tablet Commonly known as: CIPRO Take 500 mg by mouth 2 (two) times daily.   oxybutynin 5 MG tablet Commonly known as: DITROPAN Take 1 tablet (5 mg total) by mouth 3 (three) times daily.   tamsulosin 0.4 MG Caps capsule Commonly known as: FLOMAX Take 0.4 mg by mouth.   UNABLE TO FIND Therolith vitamin and mineral supplement for Kidney stones.       Allergies:  No Known Allergies  Family History: No family history on file.  Social History:   reports that he quit smoking about 14 years ago. He smoked 1.00 pack per day. He has never used smokeless tobacco. He reports that he does not drink alcohol or use drugs.  Physical Exam: BP 124/67   Pulse (!) 42  Ht 5' 11"  (1.803 m)   Wt 250 lb (113.4 kg)   BMI 34.87 kg/m   Constitutional:  Alert and oriented, no acute distress, nontoxic appearing HEENT: [place], AT Cardiovascular: No clubbing, cyanosis, or edema Respiratory: Normal respiratory effort, no increased work of breathing Skin: No rashes, bruises or suspicious lesions Neurologic: Grossly intact, no focal deficits, moving all 4 extremities Psychiatric: Normal mood and  affect  Laboratory Data: Results for orders placed or performed in visit on 01/20/20  Microscopic Examination   URINE  Result Value Ref Range   WBC, UA 0-5 0 - 5 /hpf   RBC >30 (A) 0 - 2 /hpf   Epithelial Cells (non renal) 0-10 0 - 10 /hpf   Crystals Present (A) N/A   Crystal Type Calcium Oxalate N/A   Bacteria, UA Few None seen/Few  Urinalysis, Complete  Result Value Ref Range   Specific Gravity, UA 1.025 1.005 - 1.030   pH, UA 6.0 5.0 - 7.5   Color, UA Yellow Yellow   Appearance Ur Cloudy (A) Clear   Leukocytes,UA Trace (A) Negative   Protein,UA 1+ (A) Negative/Trace   Glucose, UA Negative Negative   Ketones, UA Negative Negative   RBC, UA 3+ (A) Negative   Bilirubin, UA Negative Negative   Urobilinogen, Ur 0.2 0.2 - 1.0 mg/dL   Nitrite, UA Negative Negative   Microscopic Examination See below:   Bladder Scan (Post Void Residual) in office  Result Value Ref Range   Scan Result 68    Assessment & Plan:   1. Acute urinary retention PVR WNL on Flomax without Foley.  No further intervention indicated.  Counseled patient to continue Flomax and contact our office immediately or proceed to the emergency room overnight and on the weekends if he redevelops symptoms of urinary retention including lower abdominal pain, inability to urinate, dribbling stream, or abdominal distention.  He expressed understanding. - Bladder Scan (Post Void Residual) in office  2. Gross hematuria Will proceed with gross hematuria evaluation at this time, as per his initial referral reason.  UA reassuring for infection.  I had a lengthy conversation today with the patient regarding the etiology of blood in the urine.  I explained that blood in the urine can be caused by a myriad of factors, including but not limited to infection, stones, cysts, anticoagulation, and urinary tract malignancies.  I explained that the recommended work-up for blood in the urine is twofold and includes a CT urogram for evaluation  of the upper urinary tract including kidneys and ureters as well as a cystoscopy for evaluation of the urethra and bladder.  I explained that these two studies complement one another in reviewing the entire urinary tract for possible causes of bleeding.  I recommended that we proceed with this at this time.  Patient agreed. - CT HEMATURIA WORKUP - Urinalysis, Complete   Return in about 4 weeks (around 02/17/2020) for Cysto and CTU results.  Debroah Loop, PA-C  [facility] [address], [place], [place] [address] [phone]

## 2020-01-24 ENCOUNTER — Other Ambulatory Visit: Payer: Self-pay

## 2020-01-24 ENCOUNTER — Ambulatory Visit (INDEPENDENT_AMBULATORY_CARE_PROVIDER_SITE_OTHER): Payer: Medicare Other | Admitting: Physician Assistant

## 2020-01-24 VITALS — BP 152/71 | HR 41 | Ht 72.0 in | Wt 250.0 lb

## 2020-01-24 DIAGNOSIS — R310 Gross hematuria: Secondary | ICD-10-CM

## 2020-01-24 DIAGNOSIS — R338 Other retention of urine: Secondary | ICD-10-CM

## 2020-01-24 DIAGNOSIS — R339 Retention of urine, unspecified: Secondary | ICD-10-CM | POA: Diagnosis not present

## 2020-01-24 LAB — URINALYSIS, COMPLETE
Bilirubin, UA: NEGATIVE
Glucose, UA: NEGATIVE
Ketones, UA: NEGATIVE
Leukocytes,UA: NEGATIVE
Nitrite, UA: NEGATIVE
Specific Gravity, UA: 1.025 (ref 1.005–1.030)
Urobilinogen, Ur: 0.2 mg/dL (ref 0.2–1.0)
pH, UA: 6 (ref 5.0–7.5)

## 2020-01-24 LAB — MICROSCOPIC EXAMINATION: RBC, Urine: >30 /hpf — AB (ref 0–2)

## 2020-01-24 LAB — BLADDER SCAN AMB NON-IMAGING: Scan Result: 353

## 2020-01-24 NOTE — Progress Notes (Signed)
Continuous Intermittent Catheterization  Due to incomplete bladder emptying patient is present today for a teaching of self I & O Catheterization. Patient was given detailed verbal and printed instructions of self catheterization. Patient was cleaned and prepped in a clean fashion.  With instruction and assistance patient inserted a 16FR coude SpeediCath Flex Coude Pro and urine return was noted 400 ml, urine was amber in color. Patient tolerated well, no complications were noted Patient was given a sample bag with supplies to take home.  Instructions were given for patient to cath 1 time daily as needed.  An order was placed with [facility] for catheters to be sent to the patient's home.  Performed by: Debroah Loop, PA-C

## 2020-01-24 NOTE — Progress Notes (Signed)
01/24/2020 11:39 AM   Heinz Knuckles Pollett 16-Apr-1956 [id number]  CC: Difficulty urinating, frequency, urgency, weak stream  HPI: Nathan Holland is a 64 y.o. male who presents today for evaluation of possible acute urinary retention.  I saw him in clinic 4 days ago for follow-up of urinary retention and gross hematuria.  He had recently passed a voiding trial in the emergency department due to multiple Foley catheter problems and is scheduled to undergo hematuria work-up in our clinic next month.  Today, patient reports difficulty urinating since last night.  He describes urinary frequency, urgency, and weak stream and is concerned for urinary retention.  He continues to take Flomax 0.4 mg daily.  PVR 3 to 53 mL.  In-office UA today positive for 3+ blood and trace protein; urine microscopy with 0-5 WBCs/HPF and>30 RBCs/HPF.  PMH: Past Medical History:  Diagnosis Date  . Cancer [facility]) 2012   prostate with seed placement  . History of kidney stones 2011,2012   previous lithotripsies    Surgical History: Past Surgical History:  Procedure Laterality Date  . ETHMOIDECTOMY Bilateral 03/31/2018   Procedure: ETHMOIDECTOMY  WITH FRONTAL EXPLORATION;  Surgeon: Clyde Canterbury, MD;  Location: [place];  Service: ENT;  Laterality: Bilateral;  . EXTRACORPOREAL SHOCK WAVE LITHOTRIPSY  2011,2012  . EXTRACORPOREAL SHOCK WAVE LITHOTRIPSY Left 01/02/2017   Procedure: EXTRACORPOREAL SHOCK WAVE LITHOTRIPSY (ESWL);  Surgeon: Royston Cowper, MD;  Location: [facility];  Service: Urology;  Laterality: Left;  . IMAGE GUIDED SINUS SURGERY Bilateral 03/31/2018   Procedure: IMAGE GUIDED SINUS SURGERY;  Surgeon: Clyde Canterbury, MD;  Location: [place];  Service: ENT;  Laterality: Bilateral;  NEED STRYKER DISK GAVE STRYKER DISK TO CECE 6-11  KP  . MAXILLARY ANTROSTOMY Bilateral 03/31/2018   Procedure: MAXILLARY ANTROSTOMY WITH TISSUE REMOVAL;  Surgeon: Clyde Canterbury, MD;  Location: [place];  Service: ENT;  Laterality: Bilateral;    Home Medications:  Allergies as of 01/24/2020   No Known Allergies     Medication List       Accurate as of January 24, 2020 11:39 AM. If you have any questions, ask your nurse or doctor.        ciprofloxacin 500 MG tablet Commonly known as: CIPRO Take 500 mg by mouth 2 (two) times daily.   oxybutynin 5 MG tablet Commonly known as: DITROPAN Take 1 tablet (5 mg total) by mouth 3 (three) times daily.   tamsulosin 0.4 MG Caps capsule Commonly known as: FLOMAX Take 0.4 mg by mouth.   UNABLE TO FIND Therolith vitamin and mineral supplement for Kidney stones.       Allergies:  No Known Allergies  Family History: No family history on file.  Social History:   reports that he quit smoking about 14 years ago. He smoked 1.00 pack per day. He has never used smokeless tobacco. He reports that he does not drink alcohol or use drugs.  Physical Exam: BP (!) 152/71   Pulse (!) 41   Ht 6' (1.829 m)   Wt 250 lb (113.4 kg)   BMI 33.91 kg/m   Constitutional:  Alert and oriented, no acute distress, nontoxic appearing HEENT: , AT Cardiovascular: No clubbing, cyanosis, or edema Respiratory: Normal respiratory effort, no increased work of breathing Skin: No rashes, bruises or suspicious lesions Neurologic: Grossly intact, no focal deficits, moving all 4 extremities Psychiatric: Anxious mood and affect  Laboratory Data: Results for orders placed or performed in visit on 01/24/20  Microscopic  Examination   URINE  Result Value Ref Range   WBC, UA 0-5 0 - 5 /hpf   RBC >30 (A) 0 - 2 /hpf   Epithelial Cells (non renal) 0-10 0 - 10 /hpf   Bacteria, UA Few None seen/Few  Urinalysis, Complete  Result Value Ref Range   Specific Gravity, UA 1.025 1.005 - 1.030   pH, UA 6.0 5.0 - 7.5   Color, UA Yellow Yellow   Appearance Ur Cloudy (A) Clear   Leukocytes,UA Negative Negative   Protein,UA Trace (A) Negative/Trace   Glucose, UA  Negative Negative   Ketones, UA Negative Negative   RBC, UA 3+ (A) Negative   Bilirubin, UA Negative Negative   Urobilinogen, Ur 0.2 0.2 - 1.0 mg/dL   Nitrite, UA Negative Negative   Microscopic Examination See below:   Bladder Scan (Post Void Residual) in office  Result Value Ref Range   Scan Result 353    Assessment & Plan:   1. Incomplete bladder emptying 64 year old male presents with recurrent symptoms of urgency, frequency, difficulty urinating, and weak urinary stream following an episode of acute urinary retention earlier this month.  UA reassuring for infection.  He continues to take Flomax 0.4 mg daily.  I explained to the patient that he is not in overt retention at this point, however I am concerned that he is at high risk of developing retention without intervention at this time.  I offered him Foley catheter placement versus [facility] teaching today, however I am concerned about his ability to tolerate Foley catheterization.  He agreed with [facility] teaching.  See separate procedure note for details.  Of note, he did meet significant resistance at the level of the prostate, suspect BPH is underlying.  Counseled patient to catheterize approximately once daily or as needed.  I believe he is reliable in determining when he is developing difficulty emptying his bladder and can be somewhat self-directed in the frequency of self-catheterization at this point.  Also counseled him to increase Flomax to 0.8 mg daily and to reduce to 0.4 mg if he develops orthostatic hypotension on the increased dose.  He expressed understanding. - Bladder Scan (Post Void Residual) in office - Urinalysis, Complete   2. Gross hematuria Counseled patient to continue with plans for CT urogram and cystoscopy next month for evaluation of continued hematuria.  Return if symptoms worsen or fail to improve.  Debroah Loop, PA-C  [facility] [address], [place], [place]  [address] [phone]

## 2020-01-25 ENCOUNTER — Ambulatory Visit: Payer: Self-pay | Admitting: Urology

## 2020-02-04 ENCOUNTER — Other Ambulatory Visit: Payer: Self-pay | Admitting: Urology

## 2020-02-21 ENCOUNTER — Other Ambulatory Visit: Payer: Self-pay

## 2020-02-21 ENCOUNTER — Ambulatory Visit
Admission: RE | Admit: 2020-02-21 | Discharge: 2020-02-21 | Disposition: A | Discharge status: Home / Self Care | Payer: Medicare HMO | Source: Ambulatory Visit | Attending: Physician Assistant | Admitting: Physician Assistant

## 2020-02-21 DIAGNOSIS — R310 Gross hematuria: Secondary | ICD-10-CM | POA: Insufficient documentation

## 2020-02-21 LAB — POCT I-STAT CREATININE: Creatinine, Ser: 0.7 mg/dL (ref 0.61–1.24)

## 2020-02-21 MED ORDER — IOHEXOL 300 MG/ML  SOLN
125.0000 mL | Freq: Once | INTRAMUSCULAR | Status: AC | PRN
  Administered 2020-02-21: 125 mL via INTRAVENOUS

## 2020-02-22 NOTE — Progress Notes (Signed)
02/23/20   CC:  Chief Complaint  Patient presents with  . Cysto    HPI: Nathan Holland is a 64 y.o. M w/ hx of gross hematuria returns today for a cysto.   In-office UA from last visit positive for 3+ blood and trace protein; urine microscopy with 0-5 WBCs/HPF and>30 RBCs/HPF.  CT hematuria from 02/21/20 revealed 16 mm polypoid soft tissue lesion posterior left bladder wall, highly suspicious for neoplasm. Mild fullness of the left intrarenal collecting system without an obstructing stone evident. No substantial left hydroureter. Bilateral renal cysts with tiny cortical hypodensities in each kidney too small to characterize. Tiny nonobstructing renal stones bilaterally.  He is currently not on any blood thinners.   Please see previous note for details.   NED. A&Ox3.   No respiratory distress   Abd soft, NT, ND Normal phallus with bilateral descended testicles  Cystoscopy Procedure Note  Patient identification was confirmed, informed consent was obtained, and patient was prepped using Betadine solution.  Lidocaine jelly was administered per urethral meatus.     Pre-Procedure: - Inspection reveals a normal caliber ureteral meatus.  Procedure: The flexible cystoscope was introduced without difficulty - No urethral strictures/lesions are present. - Necrosis at level of sphincter  - Normal prostate  - Normal bladder neck - Bilateral ureteral orifices identified - Bladder mucosa reveals large spherical tumor w/ calcification/blood product 2 cm on left lateral bladder wall. Capillary carpeting noted just beyond trigone.  - No bladder stones - No trabeculation  Post-Procedure: - Patient tolerated the procedure well  Pertinent Imagings:  CLINICAL DATA:  Gross hematuria.  EXAM: CT ABDOMEN AND PELVIS WITHOUT AND WITH CONTRAST  TECHNIQUE: Multidetector CT imaging of the abdomen and pelvis was performed following the standard protocol before and following the  bolus administration of intravenous contrast.  CONTRAST:  160mL OMNIPAQUE IOHEXOL 300 MG/ML  SOLN  COMPARISON:  None.  FINDINGS: Lower chest: Unremarkable.  Hepatobiliary: No suspicious focal abnormality within the liver parenchyma. There is no evidence for gallstones, gallbladder wall thickening, or pericholecystic fluid. No intrahepatic or extrahepatic biliary dilation.  Pancreas: No focal mass lesion. No dilatation of the main duct. No intraparenchymal cyst. No peripancreatic edema.  Spleen: No splenomegaly. No focal mass lesion.  Adrenals/Urinary Tract: No adrenal nodule or mass.  Precontrast imaging shows several punctate stones in the right kidney with a dominant nonobstructing 6 mm stone in the lower pole. Coronal imaging well documents a 2-3 mm nonobstructing interpolar left renal stone with probable additional punctate nonobstructing stones in the left kidney. No ureteral stones. Calcified soft tissue lesion noted posterior left bladder wall.  Imaging after IV contrast administration shows bilateral renal cysts with tiny cortical hypodensities in each kidney too small to characterize. There is mild fullness of the left intrarenal collecting system.  Delayed imaging shows no wall thickening or soft tissue filling defect in either intrarenal collecting system or renal pelvis. Right ureter is well opacified and unremarkable. Left ureter is incompletely opacified but shows no wall thickening or focal mass lesion.  16 mm polypoid soft tissue lesion noted posterior left bladder wall, well demonstrated axial image 86/series 17).  Stomach/Bowel: Stomach is unremarkable. No gastric wall thickening. No evidence of outlet obstruction. Duodenum is normally positioned as is the ligament of Treitz. No small bowel wall thickening. No small bowel dilatation. The terminal ileum is normal. The appendix is normal. No gross colonic mass. No colonic wall  thickening.  Vascular/Lymphatic: There is abdominal aortic atherosclerosis without aneurysm. There  is no gastrohepatic or hepatoduodenal ligament lymphadenopathy. No retroperitoneal or mesenteric lymphadenopathy. No pelvic sidewall lymphadenopathy.  Reproductive: Brachytherapy seeds noted in the prostate gland.  Other: No intraperitoneal free fluid.  Musculoskeletal: No worrisome lytic or sclerotic osseous abnormality.  IMPRESSION: 1. 16 mm polypoid soft tissue lesion posterior left bladder wall, highly suspicious for neoplasm. 2. Mild fullness of the left intrarenal collecting system without an obstructing stone evident. No substantial left hydroureter. 3. Bilateral renal cysts with tiny cortical hypodensities in each kidney too small to characterize. 4. Tiny nonobstructing renal stones bilaterally. 5. Aortic Atherosclerosis (ICD10-I70.0).   Electronically Signed   By: Misty Stanley M.D.   On: 02/21/2020 09:33  I have personally reviewed the images and agree with radiologist interpretation.   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  This will most likely be an outpatient procedure pending the degree of post op hematuria.  He will go home with catheter for a few days post op and will either be taught how to remove his own catheter or return to the office for catheter removal. Risk of bleeding, infection, damage surrounding structures, and injury to the bladder discussed.  Pre-op UA/culture   I, Lucas Mallow, am acting as a scribe for Dr. Hollice Espy,  I have reviewed the above documentation for accuracy and completeness, and I agree with the above.   Hollice Espy,  MD

## 2020-02-22 NOTE — H&P (View-Only) (Signed)
02/23/20   CC:  Chief Complaint  Patient presents with  . Cysto    HPI: Nathan Holland is a 64 y.o. M w/ hx of gross hematuria returns today for a cysto.   In-office UA from last visit positive for 3+ blood and trace protein; urine microscopy with 0-5 WBCs/HPF and>30 RBCs/HPF.  CT hematuria from 02/21/20 revealed 16 mm polypoid soft tissue lesion posterior left bladder wall, highly suspicious for neoplasm. Mild fullness of the left intrarenal collecting system without an obstructing stone evident. No substantial left hydroureter. Bilateral renal cysts with tiny cortical hypodensities in each kidney too small to characterize. Tiny nonobstructing renal stones bilaterally.  He is currently not on any blood thinners.   Please see previous note for details.   NED. A&Ox3.   No respiratory distress   Abd soft, NT, ND Normal phallus with bilateral descended testicles  Cystoscopy Procedure Note  Patient identification was confirmed, informed consent was obtained, and patient was prepped using Betadine solution.  Lidocaine jelly was administered per urethral meatus.     Pre-Procedure: - Inspection reveals a normal caliber ureteral meatus.  Procedure: The flexible cystoscope was introduced without difficulty - No urethral strictures/lesions are present. - Necrosis at level of sphincter  - Normal prostate  - Normal bladder neck - Bilateral ureteral orifices identified - Bladder mucosa reveals large spherical tumor w/ calcification/blood product 2 cm on left lateral bladder wall. Capillary carpeting noted just beyond trigone.  - No bladder stones - No trabeculation  Post-Procedure: - Patient tolerated the procedure well  Pertinent Imagings:  CLINICAL DATA:  Gross hematuria.  EXAM: CT ABDOMEN AND PELVIS WITHOUT AND WITH CONTRAST  TECHNIQUE: Multidetector CT imaging of the abdomen and pelvis was performed following the standard protocol before and following the  bolus administration of intravenous contrast.  CONTRAST:  134mL OMNIPAQUE IOHEXOL 300 MG/ML  SOLN  COMPARISON:  None.  FINDINGS: Lower chest: Unremarkable.  Hepatobiliary: No suspicious focal abnormality within the liver parenchyma. There is no evidence for gallstones, gallbladder wall thickening, or pericholecystic fluid. No intrahepatic or extrahepatic biliary dilation.  Pancreas: No focal mass lesion. No dilatation of the main duct. No intraparenchymal cyst. No peripancreatic edema.  Spleen: No splenomegaly. No focal mass lesion.  Adrenals/Urinary Tract: No adrenal nodule or mass.  Precontrast imaging shows several punctate stones in the right kidney with a dominant nonobstructing 6 mm stone in the lower pole. Coronal imaging well documents a 2-3 mm nonobstructing interpolar left renal stone with probable additional punctate nonobstructing stones in the left kidney. No ureteral stones. Calcified soft tissue lesion noted posterior left bladder wall.  Imaging after IV contrast administration shows bilateral renal cysts with tiny cortical hypodensities in each kidney too small to characterize. There is mild fullness of the left intrarenal collecting system.  Delayed imaging shows no wall thickening or soft tissue filling defect in either intrarenal collecting system or renal pelvis. Right ureter is well opacified and unremarkable. Left ureter is incompletely opacified but shows no wall thickening or focal mass lesion.  16 mm polypoid soft tissue lesion noted posterior left bladder wall, well demonstrated axial image 86/series 17).  Stomach/Bowel: Stomach is unremarkable. No gastric wall thickening. No evidence of outlet obstruction. Duodenum is normally positioned as is the ligament of Treitz. No small bowel wall thickening. No small bowel dilatation. The terminal ileum is normal. The appendix is normal. No gross colonic mass. No colonic wall  thickening.  Vascular/Lymphatic: There is abdominal aortic atherosclerosis without aneurysm. There  is no gastrohepatic or hepatoduodenal ligament lymphadenopathy. No retroperitoneal or mesenteric lymphadenopathy. No pelvic sidewall lymphadenopathy.  Reproductive: Brachytherapy seeds noted in the prostate gland.  Other: No intraperitoneal free fluid.  Musculoskeletal: No worrisome lytic or sclerotic osseous abnormality.  IMPRESSION: 1. 16 mm polypoid soft tissue lesion posterior left bladder wall, highly suspicious for neoplasm. 2. Mild fullness of the left intrarenal collecting system without an obstructing stone evident. No substantial left hydroureter. 3. Bilateral renal cysts with tiny cortical hypodensities in each kidney too small to characterize. 4. Tiny nonobstructing renal stones bilaterally. 5. Aortic Atherosclerosis (ICD10-I70.0).   Electronically Signed   By: Misty Stanley M.D.   On: 02/21/2020 09:33  I have personally reviewed the images and agree with radiologist interpretation.   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  This will most likely be an outpatient procedure pending the degree of post op hematuria.  He will go home with catheter for a few days post op and will either be taught how to remove his own catheter or return to the office for catheter removal. Risk of bleeding, infection, damage surrounding structures, and injury to the bladder discussed.  Pre-op UA/culture   I, Lucas Mallow, am acting as a scribe for Dr. Hollice Espy,  I have reviewed the above documentation for accuracy and completeness, and I agree with the above.   Hollice Espy,  MD

## 2020-02-23 ENCOUNTER — Ambulatory Visit: Payer: Medicare HMO | Admitting: Urology

## 2020-02-23 ENCOUNTER — Encounter: Payer: Self-pay | Admitting: Urology

## 2020-02-23 ENCOUNTER — Other Ambulatory Visit: Payer: Self-pay | Admitting: Radiology

## 2020-02-23 ENCOUNTER — Other Ambulatory Visit
Admission: RE | Admit: 2020-02-23 | Discharge: 2020-02-23 | Disposition: A | Discharge status: Home / Self Care | Payer: Medicare HMO | Source: Ambulatory Visit | Attending: Urology | Admitting: Urology

## 2020-02-23 ENCOUNTER — Other Ambulatory Visit: Payer: Self-pay

## 2020-02-23 DIAGNOSIS — D494 Neoplasm of unspecified behavior of bladder: Secondary | ICD-10-CM

## 2020-02-23 DIAGNOSIS — Z20822 Contact with and (suspected) exposure to covid-19: Secondary | ICD-10-CM | POA: Insufficient documentation

## 2020-02-23 DIAGNOSIS — R339 Retention of urine, unspecified: Secondary | ICD-10-CM

## 2020-02-23 DIAGNOSIS — Z01812 Encounter for preprocedural laboratory examination: Secondary | ICD-10-CM | POA: Diagnosis present

## 2020-02-23 DIAGNOSIS — R310 Gross hematuria: Secondary | ICD-10-CM

## 2020-02-23 MED ORDER — GEMCITABINE CHEMO FOR BLADDER INSTILLATION 2000 MG: 2000.0000 mg | Freq: Once | INTRAVENOUS | Status: DC

## 2020-02-24 ENCOUNTER — Ambulatory Visit: Payer: Medicare HMO | Admitting: Anesthesiology

## 2020-02-24 ENCOUNTER — Encounter: Payer: Self-pay | Admitting: Urology

## 2020-02-24 ENCOUNTER — Other Ambulatory Visit: Payer: Self-pay

## 2020-02-24 ENCOUNTER — Ambulatory Visit
Admission: RE | Admit: 2020-02-24 | Discharge: 2020-02-24 | Disposition: A | Discharge status: Home / Self Care | Payer: Medicare HMO | Attending: Urology | Admitting: Urology

## 2020-02-24 ENCOUNTER — Encounter
Admission: RE | Disposition: A | Discharge status: Home / Self Care | Payer: Self-pay | Source: Home / Self Care | Attending: Urology

## 2020-02-24 DIAGNOSIS — N3091 Cystitis, unspecified with hematuria: Secondary | ICD-10-CM | POA: Diagnosis not present

## 2020-02-24 DIAGNOSIS — D494 Neoplasm of unspecified behavior of bladder: Secondary | ICD-10-CM

## 2020-02-24 DIAGNOSIS — Z87442 Personal history of urinary calculi: Secondary | ICD-10-CM | POA: Diagnosis not present

## 2020-02-24 DIAGNOSIS — I700 Atherosclerosis of aorta: Secondary | ICD-10-CM | POA: Insufficient documentation

## 2020-02-24 DIAGNOSIS — Z87891 Personal history of nicotine dependence: Secondary | ICD-10-CM | POA: Insufficient documentation

## 2020-02-24 DIAGNOSIS — C670 Malignant neoplasm of trigone of bladder: Secondary | ICD-10-CM | POA: Insufficient documentation

## 2020-02-24 DIAGNOSIS — N281 Cyst of kidney, acquired: Secondary | ICD-10-CM | POA: Diagnosis not present

## 2020-02-24 DIAGNOSIS — C672 Malignant neoplasm of lateral wall of bladder: Secondary | ICD-10-CM | POA: Insufficient documentation

## 2020-02-24 DIAGNOSIS — N35919 Unspecified urethral stricture, male, unspecified site: Secondary | ICD-10-CM | POA: Diagnosis not present

## 2020-02-24 DIAGNOSIS — C679 Malignant neoplasm of bladder, unspecified: Secondary | ICD-10-CM | POA: Insufficient documentation

## 2020-02-24 DIAGNOSIS — R310 Gross hematuria: Secondary | ICD-10-CM | POA: Insufficient documentation

## 2020-02-24 DIAGNOSIS — N3289 Other specified disorders of bladder: Secondary | ICD-10-CM

## 2020-02-24 HISTORY — PX: TRANSURETHRAL RESECTION OF BLADDER TUMOR WITH MITOMYCIN-C: SHX6459

## 2020-02-24 LAB — URINALYSIS, COMPLETE
Specific Gravity, UA: 1.025 (ref 1.005–1.030)
pH, UA: 5.5 (ref 5.0–7.5)

## 2020-02-24 LAB — MICROSCOPIC EXAMINATION: RBC, Urine: >30 /hpf — AB (ref 0–2)

## 2020-02-24 LAB — SARS CORONAVIRUS 2 (TAT 6-24 HRS): SARS Coronavirus 2: NEGATIVE

## 2020-02-24 SURGERY — TRANSURETHRAL RESECTION OF BLADDER TUMOR WITH MITOMYCIN-C
Anesthesia: General

## 2020-02-24 MED ORDER — FENTANYL CITRATE (PF) 100 MCG/2ML IJ SOLN
25.0000 ug | INTRAMUSCULAR | Status: DC | PRN
  Administered 2020-02-24 (×3): 25 ug via INTRAVENOUS

## 2020-02-24 MED ORDER — PHENYLEPHRINE HCL (PRESSORS) 10 MG/ML IV SOLN
INTRAVENOUS | Status: DC | PRN
  Administered 2020-02-24: 50 ug via INTRAVENOUS

## 2020-02-24 MED ORDER — CHLORHEXIDINE GLUCONATE 0.12 % MT SOLN: 15.0000 mL | Freq: Once | OROMUCOSAL | Status: AC

## 2020-02-24 MED ORDER — CHLORHEXIDINE GLUCONATE 0.12 % MT SOLN
OROMUCOSAL | Status: AC
  Administered 2020-02-24: 15 mL via OROMUCOSAL
  Filled 2020-02-24: qty 15

## 2020-02-24 MED ORDER — CHLORHEXIDINE GLUCONATE 0.12 % MT SOLN: 15.0000 mL | Freq: Once | OROMUCOSAL | Status: DC

## 2020-02-24 MED ORDER — LIDOCAINE HCL (CARDIAC) PF 100 MG/5ML IV SOSY
PREFILLED_SYRINGE | INTRAVENOUS | Status: DC | PRN
  Administered 2020-02-24: 100 mg via INTRAVENOUS

## 2020-02-24 MED ORDER — PROPOFOL 10 MG/ML IV BOLUS
INTRAVENOUS | Status: DC | PRN
  Administered 2020-02-24: 180 mg via INTRAVENOUS
  Administered 2020-02-24: 20 mg via INTRAVENOUS
  Administered 2020-02-24 (×2): 50 mg via INTRAVENOUS

## 2020-02-24 MED ORDER — FENTANYL CITRATE (PF) 100 MCG/2ML IJ SOLN
INTRAMUSCULAR | Status: DC | PRN
  Administered 2020-02-24 (×2): 50 ug via INTRAVENOUS

## 2020-02-24 MED ORDER — FAMOTIDINE 20 MG PO TABS: 20.0000 mg | ORAL_TABLET | Freq: Once | ORAL | Status: AC

## 2020-02-24 MED ORDER — HYDROCODONE-ACETAMINOPHEN 5-325 MG PO TABS: 1.0000 | ORAL_TABLET | Freq: Four times a day (QID) | ORAL | 0 refills | Status: DC | PRN

## 2020-02-24 MED ORDER — FAMOTIDINE 20 MG PO TABS
ORAL_TABLET | ORAL | Status: AC
  Administered 2020-02-24: 20 mg via ORAL
  Filled 2020-02-24: qty 1

## 2020-02-24 MED ORDER — LACTATED RINGERS IV SOLN: INTRAVENOUS | Status: DC | PRN

## 2020-02-24 MED ORDER — ORAL CARE MOUTH RINSE: 15.0000 mL | Freq: Once | OROMUCOSAL | Status: AC

## 2020-02-24 MED ORDER — CEFAZOLIN SODIUM-DEXTROSE 2-4 GM/100ML-% IV SOLN
2.0000 g | INTRAVENOUS | Status: AC
  Administered 2020-02-24: 2 g via INTRAVENOUS

## 2020-02-24 MED ORDER — FENTANYL CITRATE (PF) 100 MCG/2ML IJ SOLN
INTRAMUSCULAR | Status: AC
  Administered 2020-02-24: 25 ug via INTRAVENOUS
  Filled 2020-02-24: qty 2

## 2020-02-24 MED ORDER — ACETAMINOPHEN 10 MG/ML IV SOLN: 1000.0000 mg | Freq: Once | INTRAVENOUS | Status: DC | PRN

## 2020-02-24 MED ORDER — DEXAMETHASONE SODIUM PHOSPHATE 10 MG/ML IJ SOLN
INTRAMUSCULAR | Status: DC | PRN
  Administered 2020-02-24: 10 mg via INTRAVENOUS

## 2020-02-24 MED ORDER — GEMCITABINE CHEMO FOR BLADDER INSTILLATION 2000 MG
INTRAVENOUS | Status: DC | PRN
  Administered 2020-02-24: 2000 mg via INTRAVESICAL

## 2020-02-24 MED ORDER — LACTATED RINGERS IV SOLN: INTRAVENOUS | Status: DC

## 2020-02-24 MED ORDER — OXYCODONE HCL 5 MG/5ML PO SOLN: 5.0000 mg | Freq: Once | ORAL | Status: DC | PRN

## 2020-02-24 MED ORDER — OXYCODONE HCL 5 MG PO TABS: 5.0000 mg | ORAL_TABLET | Freq: Once | ORAL | Status: DC | PRN

## 2020-02-24 MED ORDER — MIDAZOLAM HCL 2 MG/2ML IJ SOLN
INTRAMUSCULAR | Status: DC | PRN
  Administered 2020-02-24: 2 mg via INTRAVENOUS

## 2020-02-24 MED ORDER — DOCUSATE SODIUM 100 MG PO CAPS
100.0000 mg | ORAL_CAPSULE | Freq: Two times a day (BID) | ORAL | 0 refills | Status: DC
Start: 2020-02-24 — End: 2020-10-25

## 2020-02-24 MED ORDER — OXYBUTYNIN CHLORIDE 5 MG PO TABS
5.0000 mg | ORAL_TABLET | Freq: Three times a day (TID) | ORAL | 0 refills | Status: DC | PRN
Start: 2020-02-24 — End: 2020-10-25

## 2020-02-24 MED ORDER — ONDANSETRON HCL 4 MG/2ML IJ SOLN
INTRAMUSCULAR | Status: DC | PRN
  Administered 2020-02-24: 4 mg via INTRAVENOUS

## 2020-02-24 MED ORDER — FENTANYL CITRATE (PF) 100 MCG/2ML IJ SOLN
INTRAMUSCULAR | Status: AC
  Filled 2020-02-24: qty 2

## 2020-02-24 MED ORDER — MIDAZOLAM HCL 2 MG/2ML IJ SOLN
INTRAMUSCULAR | Status: AC
  Filled 2020-02-24: qty 2

## 2020-02-24 MED ORDER — CEFAZOLIN SODIUM-DEXTROSE 2-4 GM/100ML-% IV SOLN
INTRAVENOUS | Status: AC
  Filled 2020-02-24: qty 100

## 2020-02-24 MED ORDER — ONDANSETRON HCL 4 MG/2ML IJ SOLN: 4.0000 mg | Freq: Once | INTRAMUSCULAR | Status: DC | PRN

## 2020-02-24 SURGICAL SUPPLY — 25 items
BAG DRAIN CYSTO-URO LG1000N (MISCELLANEOUS) ×2 IMPLANT
BAG URINE DRAIN 2000ML AR STRL (UROLOGICAL SUPPLIES) ×2 IMPLANT
BRUSH SCRUB EZ  4% CHG (MISCELLANEOUS) ×1
BRUSH SCRUB EZ 4% CHG (MISCELLANEOUS) ×1 IMPLANT
CATH FOLEY 2WAY  5CC 16FR (CATHETERS) ×1
CATH URTH 16FR FL 2W BLN LF (CATHETERS) ×1 IMPLANT
CRADLE LAMINECT ARM (MISCELLANEOUS) ×2 IMPLANT
DRAPE UTILITY 15X26 TOWEL STRL (DRAPES) ×2 IMPLANT
DRSG TELFA 4X3 1S NADH ST (GAUZE/BANDAGES/DRESSINGS) ×2 IMPLANT
ELECT LOOP 22F BIPOLAR SML (ELECTROSURGICAL)
ELECT REM PT RETURN 9FT ADLT (ELECTROSURGICAL)
ELECTRODE LOOP 22F BIPOLAR SML (ELECTROSURGICAL) IMPLANT
ELECTRODE REM PT RTRN 9FT ADLT (ELECTROSURGICAL) IMPLANT
GLOVE BIO SURGEON STRL SZ 6.5 (GLOVE) ×2 IMPLANT
GOWN STRL REUS W/ TWL LRG LVL3 (GOWN DISPOSABLE) ×2 IMPLANT
GOWN STRL REUS W/TWL LRG LVL3 (GOWN DISPOSABLE) ×2
KIT TURNOVER CYSTO (KITS) ×2 IMPLANT
LOOP CUT BIPOLAR 24F LRG (ELECTROSURGICAL) ×2 IMPLANT
NDL SAFETY ECLIPSE 18X1.5 (NEEDLE) ×1 IMPLANT
NEEDLE HYPO 18GX1.5 SHARP (NEEDLE) ×1
PACK CYSTO AR (MISCELLANEOUS) ×2 IMPLANT
SET IRRIG Y TYPE TUR BLADDER L (SET/KITS/TRAYS/PACK) ×2 IMPLANT
SURGILUBE 2OZ TUBE FLIPTOP (MISCELLANEOUS) ×2 IMPLANT
SYRINGE IRR TOOMEY STRL 70CC (SYRINGE) ×2 IMPLANT
WATER STERILE IRR 1000ML POUR (IV SOLUTION) ×2 IMPLANT

## 2020-02-24 NOTE — OR Nursing (Signed)
Per Dr. Erlene Quan secure chat, patient does not need to void prior to discharge.  States she told spouse to call her if no void by 4 pm.

## 2020-02-24 NOTE — Interval H&P Note (Signed)
History and Physical Interval Note:  02/24/2020 10:06 AM  Nathan Holland  has presented today for surgery, with the diagnosis of bladder tumor.  The various methods of treatment have been discussed with the patient and family. After consideration of risks, benefits and other options for treatment, the patient has consented to  Procedure(s): TRANSURETHRAL RESECTION OF BLADDER TUMOR WITH Gemcitabine (N/A) as a surgical intervention.  The patient's history has been reviewed, patient examined, no change in status, stable for surgery.  I have reviewed the patient's chart and labs.  Questions were answered to the patient's satisfaction.    RRR CTAB  Hollice Espy

## 2020-02-24 NOTE — Transfer of Care (Signed)
Immediate Anesthesia Transfer of Care Note  Patient: Nathan Holland  Procedure(s) Performed: TRANSURETHRAL RESECTION OF BLADDER TUMOR WITH Gemcitabine (N/A )  Patient Location: PACU  Anesthesia Type:General  Level of Consciousness: sedated  Airway & Oxygen Therapy: Patient Spontanous Breathing and Patient connected to face mask oxygen  Post-op Assessment: Report given to RN and Post -op Vital signs reviewed and stable  Post vital signs: Reviewed and stable  Last Vitals:  Vitals Value Taken Time  BP 124/84 02/24/20 1137  Temp 36.2 C 02/24/20 1137  Pulse 58 02/24/20 1142  Resp 14 02/24/20 1142  SpO2 99 % 02/24/20 1142  Vitals shown include unvalidated device data.  Last Pain:  Vitals:   02/24/20 1137  TempSrc:   PainSc: (P) 0-No pain         Complications: No apparent anesthesia complications

## 2020-02-24 NOTE — Anesthesia Preprocedure Evaluation (Signed)
Anesthesia Evaluation  Patient identified by MRN, date of birth, ID band Patient awake    Reviewed: Allergy & Precautions, NPO status , Patient's Chart, lab work & pertinent test results  Airway Mallampati: II  TM Distance: >3 FB Neck ROM: Full    Dental  (+) Poor Dentition, Dental Advisory Given   Pulmonary neg shortness of breath, neg sleep apnea, neg recent URI, Patient abstained from smoking.Not current smoker, former smoker,    breath sounds clear to auscultation       Cardiovascular (-) angina(-) Past MI and (-) Cardiac Stents negative cardio ROS   Rhythm:Regular Rate:Normal     Neuro/Psych negative neurological ROS  negative psych ROS   GI/Hepatic negative GI ROS, Neg liver ROS,   Endo/Other  BMI 35  Renal/GU Hx kidney stones     Musculoskeletal   Abdominal   Peds  Hematology negative hematology ROS (+)   Anesthesia Other Findings Hx prostate cancer  Reproductive/Obstetrics                             Anesthesia Physical  Anesthesia Plan  ASA: II  Anesthesia Plan: General   Post-op Pain Management:    Induction: Intravenous  PONV Risk Score and Plan: 3 and Ondansetron and Dexamethasone  Airway Management Planned: Oral ETT  Additional Equipment: None  Intra-op Plan:   Post-operative Plan: Extubation in OR  Informed Consent: I have reviewed the patients History and Physical, chart, labs and discussed the procedure including the risks, benefits and alternatives for the proposed anesthesia with the patient or authorized representative who has indicated his/her understanding and acceptance.     Dental advisory given  Plan Discussed with: CRNA  Anesthesia Plan Comments: (Discussed risks of anesthesia with patient, including PONV, sore throat, lip/dental damage. Rare risks discussed as well, such as cardiorespiratory and neurological sequelae. Patient understands.)         Anesthesia Quick Evaluation

## 2020-02-24 NOTE — Anesthesia Procedure Notes (Signed)
Procedure Name: LMA Insertion Date/Time: 02/24/2020 10:44 AM Performed by: Justus Memory, CRNA Pre-anesthesia Checklist: Patient identified, Patient being monitored, Timeout performed, Emergency Drugs available and Suction available Patient Re-evaluated:Patient Re-evaluated prior to induction Oxygen Delivery Method: Circle system utilized Preoxygenation: Pre-oxygenation with 100% oxygen Induction Type: IV induction Ventilation: Mask ventilation without difficulty LMA: LMA inserted LMA Size: 4.5 Tube type: Oral Number of attempts: 1 Placement Confirmation: positive ETCO2 and breath sounds checked- equal and bilateral Tube secured with: Tape Dental Injury: Teeth and Oropharynx as per pre-operative assessment

## 2020-02-24 NOTE — Discharge Instructions (Signed)
Transurethral Resection of Bladder Tumor (TURBT) or Bladder Biopsy ° ° °Definition: ° Transurethral Resection of the Bladder Tumor is a surgical procedure used to diagnose and remove tumors within the bladder. TURBT is the most common treatment for early stage bladder cancer. ° °General instructions: °   ° Your recent bladder surgery requires very little post hospital care but some definite precautions. ° °Despite the fact that no skin incisions were used, the area around the bladder incisions are raw and covered with scabs to promote healing and prevent bleeding. Certain precautions are needed to insure that the scabs are not disturbed over the next 2-4 weeks while the healing proceeds. ° °Because the raw surface inside your bladder and the irritating effects of urine you may expect frequency of urination and/or urgency (a stronger desire to urinate) and perhaps even getting up at night more often. This will usually resolve or improve slowly over the healing period. You may see some blood in your urine over the first 6 weeks. Do not be alarmed, even if the urine was clear for a while. Get off your feet and drink lots of fluids until clearing occurs. If you start to pass clots or don't improve call us. ° °Diet: ° °You may return to your normal diet immediately. Because of the raw surface of your bladder, alcohol, spicy foods, foods high in acid and drinks with caffeine may cause irritation or frequency and should be used in moderation. To keep your urine flowing freely and avoid constipation, drink plenty of fluids during the day (8-10 glasses). Tip: Avoid cranberry juice because it is very acidic. ° °Activity: ° °Your physical activity doesn't need to be restricted. However, if you are very active, you may see some blood in the urine. We suggest that you reduce your activity under the circumstances until the bleeding has stopped. ° °Bowels: ° °It is important to keep your bowels regular during the postoperative  period. Straining with bowel movements can cause bleeding. A bowel movement every other day is reasonable. Use a mild laxative if needed, such as milk of magnesia 2-3 tablespoons, or 2 Dulcolax tablets. Call if you continue to have problems. If you had been taking narcotics for pain, before, during or after your surgery, you may be constipated. Take a laxative if necessary. ° ° ° °Medication: ° °You should resume your pre-surgery medications unless told not to. In addition you may be given an antibiotic to prevent or treat infection. Antibiotics are not always necessary. All medication should be taken as prescribed until the bottles are finished unless you are having an unusual reaction to one of the drugs. ° ° °[facility] °, [address] [address] °[phone] ° ° ° ° °

## 2020-02-24 NOTE — Anesthesia Postprocedure Evaluation (Signed)
Anesthesia Post Note  Patient: Nathan Holland  Procedure(s) Performed: TRANSURETHRAL RESECTION OF BLADDER TUMOR WITH Gemcitabine (N/A )  Patient location during evaluation: PACU Anesthesia Type: General Level of consciousness: awake and alert Pain management: pain level controlled Vital Signs Assessment: post-procedure vital signs reviewed and stable Respiratory status: spontaneous breathing, nonlabored ventilation, respiratory function stable and patient connected to nasal cannula oxygen Cardiovascular status: blood pressure returned to baseline and stable Postop Assessment: no apparent nausea or vomiting Anesthetic complications: no     Last Vitals:  Vitals:   02/24/20 1222 02/24/20 1230  BP: (!) 141/89   Pulse: (!) 58 (!) 57  Resp: 11 11  Temp: 36.4 C   SpO2: 95% 98%    Last Pain:  Vitals:   02/24/20 1230  TempSrc:   PainSc: 5                  Arita Miss

## 2020-02-24 NOTE — Op Note (Signed)
Date of procedure: 02/24/20  Preoperative diagnosis:  1. Bladder mass 2. Urethral stricture   Postoperative diagnosis:  1. Same as above  Procedure: 1. TURBT, medium 2. Urethral biopsy 3. Instillation of intravesical gemcitabine  Surgeon: Hollice Espy, MD  Anesthesia: General  Complications: None  Intraoperative findings: Mild stricture was tracking necrosis at the membranous urethra, some tissue free-floating with scope manipulation which was sent for pathologic evaluation.  Normal prostate.  Approximately 2 cm nodular tumor with overlying calcification on the left lateral bladder wall.  There is also some papillary carpeting on the trigone and some adjacent erythema on the left lateral bladder wall all of which were sampled.  EBL: Minimal  Specimens: Urinary sphincter biopsy, left lateral bladder wall tumor, tumor of the urinary trigone, adjacent left lateral bladder wall erythema  Drains: 16 French Foley catheter  Indication: Nathan Holland is a 64 y.o. patient with gross hematuria and urinary retention found to have a significant nodular tumor of the left lateral bladder wall along with other findings as listed above scheduled to undergo TURBT.  After reviewing the management options for treatment, he elected to proceed with the above surgical procedure(s). We have discussed the potential benefits and risks of the procedure, side effects of the proposed treatment, the likelihood of the patient achieving the goals of the procedure, and any potential problems that might occur during the procedure or recuperation. Informed consent has been obtained.  Description of procedure:  The patient was taken to the operating room and general anesthesia was induced.  The patient was placed in the dorsal lithotomy position, prepped and draped in the usual sterile fashion, and preoperative antibiotics were administered. A preoperative time-out was performed.   A 26 French resectoscope  was placed using the visual obturator to the level of the membranous urethra/urinary sphincter which had a somewhat stenotic and slightly shaggy necrotic appearance which is markedly different from the pendulous urethra as well as the prostatic urethra.  It barely accommodated 26 [place] resectoscope.  Upon passing the scope, a piece of tissue broke free and retropulsed into the bladder.  I sent this is a biopsy specimen of the membranous urethra.  The visual obturator was then exchanged for a bipolar loop.  Attention was turned to first the left lateral bladder wall where an approximately 2 cm tumor was encountered, had a spherical shape with some overlying calcification.  This was highly concerning for high-grade lesion.  The risks sec to scope was then used to take the tumor down to the level of the muscularis propria which could easily be seen.  There is also very small area of fat indicating a good fairly deep resection.  There is no concern for overt perforation.  Next, the resectoscope loop was used to resect some papillary change on the floor of the trigone just beyond the UOs.  This measured less than 1 cm and had a more sessile carpeting type appearance.  Finally, cold cup biopsy forceps were used to biopsy and sample some adjacent erythema to the larger spherical left lateral bladder wall mass extending towards the bladder neck.  I ultimately did also use of loop to take some additional biopsy of this area.  Each of these areas were then carefully fulgurated and excellent hemostasis was achieved.  The bladder was then drained and the scope was removed.  A 16 French Foley catheter was placed sterilely and filled with 10 cc of sterile water.  The patient was then cleaned dried, repositioned supine  position, reversed myesthesia, taken to the PACU in stable condition.  2000 mg of intravesical gemcitabine was instilled into the bladder.  This was allowed to dwell for 1 hour which was well-tolerated.   After 1 hour, the chemotherapy was drained and the catheter was removed.  Plan: I will have the patient follow-up next week for pathology results.  Hollice Espy, M.D.

## 2020-02-25 LAB — SURGICAL PATHOLOGY

## 2020-02-26 LAB — CULTURE, URINE COMPREHENSIVE

## 2020-03-06 NOTE — H&P (View-Only) (Signed)
03/07/20 4:15 PM   Nathan Holland [birth year] [id number]  Referring provider: Donnamarie Rossetti, PA-C [place],  [place] [address] Chief Complaint  Patient presents with   Follow-up    HPI: Nathan Holland is a 64 y.o. M who returns today for path report s/p TURBT.   CT hematuria from 02/21/20 revealed 16 mm polypoid soft tissue lesion posterior left bladder wall, highly suspicious for neoplasm. Mild fullness of the left intrarenal collecting system without an obstructing stone evident. No substantial left hydroureter. Bilateral renal cysts with tiny cortical hypodensities in each kidney too small to characterize. Tiny nonobstructing renal stones bilaterally.  He underwent TURBT w/ gemcitabine on 02/24/20. 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 Bladder trigone remarkable for low grade papillary urothelial carcinoma, non-invasive. Bladder erythema revealed urothelial carcinoma in situ negative for invasion.   He tolerated procedure well. He reports of no gross hematuria since surgery.   PMH: Past Medical History:  Diagnosis Date   History of kidney stones 2011,2012   previous lithotripsies   Prostate cancer [facility]) 2012   prostate with seed placement    Surgical History: Past Surgical History:  Procedure Laterality Date   ETHMOIDECTOMY Bilateral 03/31/2018   Procedure: ETHMOIDECTOMY  WITH FRONTAL EXPLORATION;  Surgeon: Clyde Canterbury, MD;  Location: [place];  Service: ENT;  Laterality: Bilateral;   EXTRACORPOREAL SHOCK WAVE LITHOTRIPSY  2011,2012   EXTRACORPOREAL SHOCK WAVE LITHOTRIPSY Left 01/02/2017   Procedure: EXTRACORPOREAL SHOCK WAVE LITHOTRIPSY (ESWL);  Surgeon: Royston Cowper, MD;  Location: [facility];  Service: Urology;  Laterality: Left;   IMAGE GUIDED SINUS SURGERY Bilateral 03/31/2018   Procedure: IMAGE GUIDED SINUS  SURGERY;  Surgeon: Clyde Canterbury, MD;  Location: [place];  Service: ENT;  Laterality: Bilateral;  NEED STRYKER DISK GAVE STRYKER DISK TO CECE 6-11  KP   MAXILLARY ANTROSTOMY Bilateral 03/31/2018   Procedure: MAXILLARY ANTROSTOMY WITH TISSUE REMOVAL;  Surgeon: Clyde Canterbury, MD;  Location: [place];  Service: ENT;  Laterality: Bilateral;   TRANSURETHRAL RESECTION OF BLADDER TUMOR WITH MITOMYCIN-C N/A 02/24/2020   Procedure: TRANSURETHRAL RESECTION OF BLADDER TUMOR WITH Gemcitabine;  Surgeon: Hollice Espy, MD;  Location: [facility];  Service: Urology;  Laterality: N/A;    Home Medications:  Allergies as of 03/07/2020   No Known Allergies     Medication List       Accurate as of March 07, 2020  4:15 PM. If you have any questions, ask your nurse or doctor.        docusate sodium 100 MG capsule Commonly known as: COLACE Take 1 capsule (100 mg total) by mouth 2 (two) times daily.   HYDROcodone-acetaminophen 5-325 MG tablet Commonly known as: NORCO/VICODIN Take 1-2 tablets by mouth every 6 (six) hours as needed for moderate pain.   oxybutynin 5 MG tablet Commonly known as: DITROPAN Take 1 tablet (5 mg total) by mouth every 8 (eight) hours as needed for bladder spasms.   tamsulosin 0.4 MG Caps capsule Commonly known as: FLOMAX Take 0.4 mg by mouth at bedtime.       Allergies: No Known Allergies  Family History: No family history on file.  Social History:  reports that he quit smoking about 14 years ago. He smoked 1.00 pack per day. He has never used smokeless tobacco. He reports that he does not drink alcohol or use drugs.   Physical Exam: BP Marland Kitchen)  151/71    Pulse (!) 42   Constitutional:  Alert and oriented, No acute distress. HEENT: [place] AT, moist mucus membranes.  Trachea midline, no masses. Cardiovascular: No clubbing, cyanosis, or edema. Respiratory: Normal respiratory effort, no increased work of breathing. Skin: No rashes, bruises or suspicious  lesions. Neurologic: Grossly intact, no focal deficits, moving all 4 extremities. Psychiatric: Normal mood and affect.  Laboratory Data:  Lab Results  Component Value Date   CREATININE 0.70 02/21/2020   SURGICAL PATHOLOGY  CASE: [id number]  PATIENT: Nathan Holland  Surgical Pathology Report      Specimen Submitted:  A. Urethral bulbar; bx  B. Bladder tumor, left lateral wall  C. Trigone  D. Erythema, adjacent   Clinical History: Bladder tumor       DIAGNOSIS:  A. URETHRAL BULBAR; BIOPSY:  - ULCERATED UROTHELIAL MUCOSA WITH ACUTE INFLAMMATION.  - NEGATIVE FOR ATYPIA AND MALIGNANCY.   B. BLADDER TUMOR, LEFT LATERAL WALL; TURBT:  - INVASIVE HIGH-GRADE PAPILLARY UROTHELIAL CARCINOMA.  - MUSCULARIS PROPRIA IS FOCALLY PRESENT AND IS NEGATIVE FOR TUMOR.   C. BLADDER, TRIGONE; BIOPSY:  - LOW-GRADE PAPILLARY UROTHELIAL CARCINOMA, NON-INVASIVE.  - MUSCULARIS PROPRIA IS PRESENT AND NEGATIVE FOR TUMOR.  - BACKGROUND FOLLICULAR CYSTITIS.   D. BLADDER, ERYTHEMA; BIOPSY:  - UROTHELIAL CARCINOMA IN SITU.  - NEGATIVE FOR INVASION.  - MUSCULARIS PROPRIA IS PRESENT AND NEGATIVE FOR TUMOR.   GROSS DESCRIPTION:  A. Labeled: Urethral bulbar, biopsy  Received: In formalin  Tissue fragment(s): 1  Size: 0.2 x 0.2 x 0.1 cm  Description: Fragment of brown soft tissue  Entirely submitted in A1.   B. Labeled: Bladder tumor, left lateral wall  Received: In formalin  Tissue fragment(s): Multiple  Size: 2 x 1.2 x 0.4 cm  Description: Fragments of pink-tan soft tissue  Entirely submitted in B1 and B2.   C. Labeled: Trigone  Received: In formalin  Tissue fragment(s): Multiple  Size: 0.5 x 0.4 x 0.2 cm  Description: Fragments of tan soft tissue  Entirely submitted in C1.   D. Labeled: Adjacent erythema  Received: In formalin  Tissue fragment(s): Multiple fragments  Size: 1.2 x 0.7 x 0.2 cm  Description: Fragments of pink-tan soft tissue  Entirely submitted in D1.     Final Diagnosis performed by Betsy Pries, MD.  Electronically signed  02/25/2020 11:48:40AM  The electronic signature indicates that the named Attending Pathologist  has evaluated the specimen  Technical component performed at [facility], [address] [place],  [place] [address] Lab: [phone] Dir: Rush Farmer, MD, MMM  Professional component performed at [facility], [facility], [place], [place], [place] [address] Lab: [phone]  Dir: Dellia Nims. Rubinas, MD   Pertinent Imaging: Results for orders placed in visit on 01/20/20  CT HEMATURIA WORKUP   Narrative CLINICAL DATA:  Gross hematuria.  EXAM: CT ABDOMEN AND PELVIS WITHOUT AND WITH CONTRAST  TECHNIQUE: Multidetector CT imaging of the abdomen and pelvis was performed following the standard protocol before and following the bolus administration of intravenous contrast.  CONTRAST:  151mL OMNIPAQUE IOHEXOL 300 MG/ML  SOLN  COMPARISON:  None.  FINDINGS: Lower chest: Unremarkable.  Hepatobiliary: No suspicious focal abnormality within the liver parenchyma. There is no evidence for gallstones, gallbladder wall thickening, or pericholecystic fluid. No intrahepatic or extrahepatic biliary dilation.  Pancreas: No focal mass lesion. No dilatation of the main duct. No intraparenchymal cyst. No peripancreatic edema.  Spleen: No splenomegaly. No focal mass lesion.  Adrenals/Urinary Tract: No adrenal nodule or  mass.  Precontrast imaging shows several punctate stones in the right kidney with a dominant nonobstructing 6 mm stone in the lower pole. Coronal imaging well documents a 2-3 mm nonobstructing interpolar left renal stone with probable additional punctate nonobstructing stones in the left kidney. No ureteral stones. Calcified soft tissue lesion noted posterior left bladder wall.  Imaging after IV contrast administration shows bilateral renal cysts with tiny cortical hypodensities in  each kidney too small to characterize. There is mild fullness of the left intrarenal collecting system.  Delayed imaging shows no wall thickening or soft tissue filling defect in either intrarenal collecting system or renal pelvis. Right ureter is well opacified and unremarkable. Left ureter is incompletely opacified but shows no wall thickening or focal mass lesion.  16 mm polypoid soft tissue lesion noted posterior left bladder wall, well demonstrated axial image 86/series 17).  Stomach/Bowel: Stomach is unremarkable. No gastric wall thickening. No evidence of outlet obstruction. Duodenum is normally positioned as is the ligament of Treitz. No small bowel wall thickening. No small bowel dilatation. The terminal ileum is normal. The appendix is normal. No gross colonic mass. No colonic wall thickening.  Vascular/Lymphatic: There is abdominal aortic atherosclerosis without aneurysm. There is no gastrohepatic or hepatoduodenal ligament lymphadenopathy. No retroperitoneal or mesenteric lymphadenopathy. No pelvic sidewall lymphadenopathy.  Reproductive: Brachytherapy seeds noted in the prostate gland.  Other: No intraperitoneal free fluid.  Musculoskeletal: No worrisome lytic or sclerotic osseous abnormality.  IMPRESSION: 1. 16 mm polypoid soft tissue lesion posterior left bladder wall, highly suspicious for neoplasm. 2. Mild fullness of the left intrarenal collecting system without an obstructing stone evident. No substantial left hydroureter. 3. Bilateral renal cysts with tiny cortical hypodensities in each kidney too small to characterize. 4. Tiny nonobstructing renal stones bilaterally. 5. Aortic Atherosclerosis (ICD10-I70.0).   Electronically Signed   By: Misty Stanley M.D.   On: 02/21/2020 09:33    I have personally reviewed the images and agree with radiologist interpretation.   Assessment & Plan:    1. Bladder Cancer Path report reviewed, indicative of high T1  w/ CIS   Repeat TURBT from 1 month of first procedure based on AUA guidelines and risk of upstaging 10% of the time-procedure discussed at length again today.  We will plan on giving him another dose of gemcitabine perioperatively.  Risk of bleeding, infection, demonstrating structures amongst others were all discussed.  BCG induction discussed following his next procedure. Discussed most common side effects including frequency, urgency, burning with urination, low incident of flu like symptoms and rare incidents of BCG sepsis.   Will repeat bx 3 months after completing BCG   Pt understood and will proceed w/ plan   Pre-op UA/culture   2. Covid-19 Vaccine Counseling  Discussed risks and benefits He will be getting his shot today Given info on local vaccination sites    [facility] [address] [place], [place] [address] [phone]  I, Lucas Mallow, am acting as a scribe for Dr. Hollice Espy,  I have reviewed the above documentation for accuracy and completeness, and I agree with the above.   Hollice Espy, MD   I spent 40 total minutes on the day of the encounter including pre-visit review of the medical record, face-to-face time with the patient, and post visit ordering of labs/imaging/tests.

## 2020-03-06 NOTE — Progress Notes (Signed)
03/07/20 4:15 PM   Nathan Holland 10-27-1955 [id number]  Referring provider: Donnamarie Rossetti, PA-C [place],  [place] [address] Chief Complaint  Patient presents with   Follow-up    HPI: Nathan Holland is a 64 y.o. M who returns today for path report s/p TURBT.   CT hematuria from 02/21/20 revealed 16 mm polypoid soft tissue lesion posterior left bladder wall, highly suspicious for neoplasm. Mild fullness of the left intrarenal collecting system without an obstructing stone evident. No substantial left hydroureter. Bilateral renal cysts with tiny cortical hypodensities in each kidney too small to characterize. Tiny nonobstructing renal stones bilaterally.  He underwent TURBT w/ gemcitabine on 02/24/20. 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 Bladder trigone remarkable for low grade papillary urothelial carcinoma, non-invasive. Bladder erythema revealed urothelial carcinoma in situ negative for invasion.   He tolerated procedure well. He reports of no gross hematuria since surgery.   PMH: Past Medical History:  Diagnosis Date   History of kidney stones 2011,2012   previous lithotripsies   Prostate cancer [facility]) 2012   prostate with seed placement    Surgical History: Past Surgical History:  Procedure Laterality Date   ETHMOIDECTOMY Bilateral 03/31/2018   Procedure: ETHMOIDECTOMY  WITH FRONTAL EXPLORATION;  Surgeon: Clyde Canterbury, MD;  Location: [place];  Service: ENT;  Laterality: Bilateral;   EXTRACORPOREAL SHOCK WAVE LITHOTRIPSY  2011,2012   EXTRACORPOREAL SHOCK WAVE LITHOTRIPSY Left 01/02/2017   Procedure: EXTRACORPOREAL SHOCK WAVE LITHOTRIPSY (ESWL);  Surgeon: Royston Cowper, MD;  Location: [facility];  Service: Urology;  Laterality: Left;   IMAGE GUIDED SINUS SURGERY Bilateral 03/31/2018   Procedure: IMAGE GUIDED SINUS  SURGERY;  Surgeon: Clyde Canterbury, MD;  Location: [place];  Service: ENT;  Laterality: Bilateral;  NEED STRYKER DISK GAVE STRYKER DISK TO CECE 6-11  KP   MAXILLARY ANTROSTOMY Bilateral 03/31/2018   Procedure: MAXILLARY ANTROSTOMY WITH TISSUE REMOVAL;  Surgeon: Clyde Canterbury, MD;  Location: [place];  Service: ENT;  Laterality: Bilateral;   TRANSURETHRAL RESECTION OF BLADDER TUMOR WITH MITOMYCIN-C N/A 02/24/2020   Procedure: TRANSURETHRAL RESECTION OF BLADDER TUMOR WITH Gemcitabine;  Surgeon: Hollice Espy, MD;  Location: [facility];  Service: Urology;  Laterality: N/A;    Home Medications:  Allergies as of 03/07/2020   No Known Allergies     Medication List       Accurate as of March 07, 2020  4:15 PM. If you have any questions, ask your nurse or doctor.        docusate sodium 100 MG capsule Commonly known as: COLACE Take 1 capsule (100 mg total) by mouth 2 (two) times daily.   HYDROcodone-acetaminophen 5-325 MG tablet Commonly known as: NORCO/VICODIN Take 1-2 tablets by mouth every 6 (six) hours as needed for moderate pain.   oxybutynin 5 MG tablet Commonly known as: DITROPAN Take 1 tablet (5 mg total) by mouth every 8 (eight) hours as needed for bladder spasms.   tamsulosin 0.4 MG Caps capsule Commonly known as: FLOMAX Take 0.4 mg by mouth at bedtime.       Allergies: No Known Allergies  Family History: No family history on file.  Social History:  reports that he quit smoking about 14 years ago. He smoked 1.00 pack per day. He has never used smokeless tobacco. He reports that he does not drink alcohol or use drugs.   Physical Exam: BP Marland Kitchen)  151/71    Pulse (!) 42   Constitutional:  Alert and oriented, No acute distress. HEENT: [place] AT, moist mucus membranes.  Trachea midline, no masses. Cardiovascular: No clubbing, cyanosis, or edema. Respiratory: Normal respiratory effort, no increased work of breathing. Skin: No rashes, bruises or suspicious  lesions. Neurologic: Grossly intact, no focal deficits, moving all 4 extremities. Psychiatric: Normal mood and affect.  Laboratory Data:  Lab Results  Component Value Date   CREATININE 0.70 02/21/2020   SURGICAL PATHOLOGY  CASE: [id number]  PATIENT: Nathan Holland  Surgical Pathology Report      Specimen Submitted:  A. Urethral bulbar; bx  B. Bladder tumor, left lateral wall  C. Trigone  D. Erythema, adjacent   Clinical History: Bladder tumor       DIAGNOSIS:  A. URETHRAL BULBAR; BIOPSY:  - ULCERATED UROTHELIAL MUCOSA WITH ACUTE INFLAMMATION.  - NEGATIVE FOR ATYPIA AND MALIGNANCY.   B. BLADDER TUMOR, LEFT LATERAL WALL; TURBT:  - INVASIVE HIGH-GRADE PAPILLARY UROTHELIAL CARCINOMA.  - MUSCULARIS PROPRIA IS FOCALLY PRESENT AND IS NEGATIVE FOR TUMOR.   C. BLADDER, TRIGONE; BIOPSY:  - LOW-GRADE PAPILLARY UROTHELIAL CARCINOMA, NON-INVASIVE.  - MUSCULARIS PROPRIA IS PRESENT AND NEGATIVE FOR TUMOR.  - BACKGROUND FOLLICULAR CYSTITIS.   D. BLADDER, ERYTHEMA; BIOPSY:  - UROTHELIAL CARCINOMA IN SITU.  - NEGATIVE FOR INVASION.  - MUSCULARIS PROPRIA IS PRESENT AND NEGATIVE FOR TUMOR.   GROSS DESCRIPTION:  A. Labeled: Urethral bulbar, biopsy  Received: In formalin  Tissue fragment(s): 1  Size: 0.2 x 0.2 x 0.1 cm  Description: Fragment of brown soft tissue  Entirely submitted in A1.   B. Labeled: Bladder tumor, left lateral wall  Received: In formalin  Tissue fragment(s): Multiple  Size: 2 x 1.2 x 0.4 cm  Description: Fragments of pink-tan soft tissue  Entirely submitted in B1 and B2.   C. Labeled: Trigone  Received: In formalin  Tissue fragment(s): Multiple  Size: 0.5 x 0.4 x 0.2 cm  Description: Fragments of tan soft tissue  Entirely submitted in C1.   D. Labeled: Adjacent erythema  Received: In formalin  Tissue fragment(s): Multiple fragments  Size: 1.2 x 0.7 x 0.2 cm  Description: Fragments of pink-tan soft tissue  Entirely submitted in D1.     Final Diagnosis performed by Betsy Pries, MD.  Electronically signed  02/25/2020 11:48:40AM  The electronic signature indicates that the named Attending Pathologist  has evaluated the specimen  Technical component performed at [facility], [address] [place],  [place] [address] Lab: [id number] Dir: Rush Farmer, MD, MMM  Professional component performed at [facility], [facility], [place], [place], [place] [address] Lab: [phone]  Dir: Dellia Nims. Rubinas, MD   Pertinent Imaging: Results for orders placed in visit on 01/20/20  CT HEMATURIA WORKUP   Narrative CLINICAL DATA:  Gross hematuria.  EXAM: CT ABDOMEN AND PELVIS WITHOUT AND WITH CONTRAST  TECHNIQUE: Multidetector CT imaging of the abdomen and pelvis was performed following the standard protocol before and following the bolus administration of intravenous contrast.  CONTRAST:  145mL OMNIPAQUE IOHEXOL 300 MG/ML  SOLN  COMPARISON:  None.  FINDINGS: Lower chest: Unremarkable.  Hepatobiliary: No suspicious focal abnormality within the liver parenchyma. There is no evidence for gallstones, gallbladder wall thickening, or pericholecystic fluid. No intrahepatic or extrahepatic biliary dilation.  Pancreas: No focal mass lesion. No dilatation of the main duct. No intraparenchymal cyst. No peripancreatic edema.  Spleen: No splenomegaly. No focal mass lesion.  Adrenals/Urinary Tract: No adrenal nodule or  mass.  Precontrast imaging shows several punctate stones in the right kidney with a dominant nonobstructing 6 mm stone in the lower pole. Coronal imaging well documents a 2-3 mm nonobstructing interpolar left renal stone with probable additional punctate nonobstructing stones in the left kidney. No ureteral stones. Calcified soft tissue lesion noted posterior left bladder wall.  Imaging after IV contrast administration shows bilateral renal cysts with tiny cortical hypodensities in  each kidney too small to characterize. There is mild fullness of the left intrarenal collecting system.  Delayed imaging shows no wall thickening or soft tissue filling defect in either intrarenal collecting system or renal pelvis. Right ureter is well opacified and unremarkable. Left ureter is incompletely opacified but shows no wall thickening or focal mass lesion.  16 mm polypoid soft tissue lesion noted posterior left bladder wall, well demonstrated axial image 86/series 17).  Stomach/Bowel: Stomach is unremarkable. No gastric wall thickening. No evidence of outlet obstruction. Duodenum is normally positioned as is the ligament of Treitz. No small bowel wall thickening. No small bowel dilatation. The terminal ileum is normal. The appendix is normal. No gross colonic mass. No colonic wall thickening.  Vascular/Lymphatic: There is abdominal aortic atherosclerosis without aneurysm. There is no gastrohepatic or hepatoduodenal ligament lymphadenopathy. No retroperitoneal or mesenteric lymphadenopathy. No pelvic sidewall lymphadenopathy.  Reproductive: Brachytherapy seeds noted in the prostate gland.  Other: No intraperitoneal free fluid.  Musculoskeletal: No worrisome lytic or sclerotic osseous abnormality.  IMPRESSION: 1. 16 mm polypoid soft tissue lesion posterior left bladder wall, highly suspicious for neoplasm. 2. Mild fullness of the left intrarenal collecting system without an obstructing stone evident. No substantial left hydroureter. 3. Bilateral renal cysts with tiny cortical hypodensities in each kidney too small to characterize. 4. Tiny nonobstructing renal stones bilaterally. 5. Aortic Atherosclerosis (ICD10-I70.0).   Electronically Signed   By: Misty Stanley M.D.   On: 02/21/2020 09:33    I have personally reviewed the images and agree with radiologist interpretation.   Assessment & Plan:    1. Bladder Cancer Path report reviewed, indicative of high T1  w/ CIS   Repeat TURBT from 1 month of first procedure based on AUA guidelines and risk of upstaging 10% of the time-procedure discussed at length again today.  We will plan on giving him another dose of gemcitabine perioperatively.  Risk of bleeding, infection, demonstrating structures amongst others were all discussed.  BCG induction discussed following his next procedure. Discussed most common side effects including frequency, urgency, burning with urination, low incident of flu like symptoms and rare incidents of BCG sepsis.   Will repeat bx 3 months after completing BCG   Pt understood and will proceed w/ plan   Pre-op UA/culture   2. Covid-19 Vaccine Counseling  Discussed risks and benefits He will be getting his shot today Given info on local vaccination sites    [facility] [address] [place], [place] [address] [phone]  I, Lucas Mallow, am acting as a scribe for Dr. Hollice Espy,  I have reviewed the above documentation for accuracy and completeness, and I agree with the above.   Hollice Espy, MD   I spent 40 total minutes on the day of the encounter including pre-visit review of the medical record, face-to-face time with the patient, and post visit ordering of labs/imaging/tests.

## 2020-03-07 ENCOUNTER — Encounter: Payer: Self-pay | Admitting: Urology

## 2020-03-07 ENCOUNTER — Other Ambulatory Visit: Payer: Self-pay | Admitting: Radiology

## 2020-03-07 ENCOUNTER — Ambulatory Visit (INDEPENDENT_AMBULATORY_CARE_PROVIDER_SITE_OTHER): Payer: Medicare HMO | Admitting: Urology

## 2020-03-07 ENCOUNTER — Other Ambulatory Visit: Payer: Self-pay

## 2020-03-07 VITALS — BP 151/71 | HR 42

## 2020-03-07 DIAGNOSIS — C679 Malignant neoplasm of bladder, unspecified: Secondary | ICD-10-CM

## 2020-03-07 DIAGNOSIS — D494 Neoplasm of unspecified behavior of bladder: Secondary | ICD-10-CM | POA: Diagnosis not present

## 2020-03-07 LAB — URINALYSIS, COMPLETE
Bilirubin, UA: NEGATIVE
Glucose, UA: NEGATIVE
Ketones, UA: NEGATIVE
Leukocytes,UA: NEGATIVE
Nitrite, UA: NEGATIVE
Specific Gravity, UA: >1.03 — ABNORMAL HIGH (ref 1.005–1.030)
Urobilinogen, Ur: 0.2 mg/dL (ref 0.2–1.0)
pH, UA: 5 (ref 5.0–7.5)

## 2020-03-07 LAB — MICROSCOPIC EXAMINATION: RBC, Urine: >30 /hpf — AB (ref 0–2)

## 2020-03-07 MED ORDER — GEMCITABINE CHEMO FOR BLADDER INSTILLATION 2000 MG: 2000.0000 mg | Freq: Once | INTRAVENOUS | Status: DC

## 2020-03-07 NOTE — Patient Instructions (Signed)

## 2020-03-09 LAB — CULTURE, URINE COMPREHENSIVE

## 2020-03-15 ENCOUNTER — Telehealth: Payer: Self-pay | Admitting: Radiology

## 2020-03-15 ENCOUNTER — Other Ambulatory Visit: Payer: Self-pay | Admitting: Radiology

## 2020-03-15 DIAGNOSIS — C679 Malignant neoplasm of bladder, unspecified: Secondary | ICD-10-CM

## 2020-03-15 MED ORDER — NITROFURANTOIN MONOHYD MACRO 100 MG PO CAPS: 100.0000 mg | ORAL_CAPSULE | Freq: Two times a day (BID) | ORAL | 0 refills | Status: DC

## 2020-03-15 NOTE — Telephone Encounter (Signed)
Notified patient of urine culture results and script sent to pharmacy.

## 2020-03-15 NOTE — Telephone Encounter (Signed)
-----   Message from Hollice Espy, MD sent at 03/15/2020 10:42 AM EDT ----- This is likely a contaminant but I'd like to go ahead and treat him with nitrofurantoin 100 mg twice daily string 5 days before the procedure for total of 7-day course.  Hollice Espy, MD

## 2020-03-17 ENCOUNTER — Encounter
Admission: RE | Admit: 2020-03-17 | Discharge: 2020-03-17 | Disposition: A | Discharge status: Home / Self Care | Payer: Medicare HMO | Source: Ambulatory Visit | Attending: Urology | Admitting: Urology

## 2020-03-17 ENCOUNTER — Other Ambulatory Visit: Payer: Self-pay

## 2020-03-17 HISTORY — DX: Gastro-esophageal reflux disease without esophagitis: K21.9

## 2020-03-17 NOTE — Patient Instructions (Addendum)
INSTRUCTIONS FOR SURGERY     Your surgery is scheduled for:   Thursday, March 23, 2020     To find out your arrival time for the day of surgery,          please call [phone] between 1 pm and 3 pm on :  Wednesday, June 16TH     When you arrive for surgery, report to the [facility].       Do NOT stop on the first floor to register.    REMEMBER: Instructions that are not followed completely may result in serious medical risk,  up to and including death, or upon the discretion of your surgeon and anesthesiologist,            your surgery may need to be rescheduled.  __X__ 1. Do not eat food after midnight the night before your procedure.                    No gum, candy, lozenger, tic tacs, tums or hard candies.                  ABSOLUTELY NOTHING SOLID IN YOUR MOUTH AFTER MIDNIGHT                    You may drink unlimited clear liquids up to 2 hours before you are scheduled to arrive for surgery.                   Do not drink anything within those 2 hours unless you need to take medicine, then take the                   smallest amount you need.  Clear liquids include:  water, apple juice without pulp,                   any flavor Gatorade, Black coffee, black tea.  Sugar may be added but no dairy/ honey /lemon.                        Broth and jello is not considered a clear liquid.  __x__  2. On the morning of surgery, please brush your teeth with toothpaste and water. You may rinse with                  mouthwash if you wish but DO NOT SWALLOW TOOTHPASTE OR MOUTHWASH  __X___3. NO alcohol for 24 hours before or after surgery.  __x___ 4.  Do NOT smoke or use e-cigarettes for 24 HOURS PRIOR TO SURGERY.                      DO NOT Use any chewable tobacco products for at least 6 hours prior to surgery.  __x___ 5. If you start any new medication after this appointment and prior to surgery, please                    Bring it with you on the day of surgery.  ___x__ 6. Notify your doctor if there is any change  in your medical condition, such as fever,                  infection, vomitting, diarrhea or any open sores.  __x___ 7.  USE the DIAL SOAP as instructed, the night before surgery and the day of surgery.                   Once you have washed with this soap, do NOT use any of the following: Powders, perfumes                    or lotions. Please do not wear make up, hairpins, clips or nail polish. You MAY wear deodorant.                   Men may shave their face and neck.  Women need to shave 48 hours prior to surgery.                   DO NOT wear ANY jewelry on the day of surgery. If there are rings that are too tight to                    remove easily, please address this prior to the surgery day. Piercings need to be removed.                                                                     NO METAL ON YOUR BODY.                    Do NOT bring any valuables.  If you came to Pre-Admit testing then you will not need license,                     insurance card or credit card.  If you will be staying overnight, please either leave your things in                     the car or have your family be responsible for these items.                     [facility] IS NOT RESPONSIBLE FOR BELONGINGS OR VALUABLES.  ___X__ 8. DO NOT wear contact lenses on surgery day.  You may not have dentures,                     Hearing aides, contacts or glasses in the operating room. These items can be                    Placed in the Recovery Room to receive immediately after surgery.  __x___ 9. IF YOU ARE SCHEDULED TO GO HOME ON THE SAME DAY, YOU MUST                   Have someone to drive you home and to stay with you  for the first 24 hours.                    Have an arrangement prior to arriving on surgery day.  ___x__ 10. Take the following medications on  the morning of surgery with a sip of water:                               1. TAMSULOSIN                     2.  NITROFURANTIN                     3.                      _____ 11.  Follow any instructions provided to you by your surgeon.                        Such as enema, clear liquid bowel prep  __X__  12. STOP ALL ASPIRIN PRODUCTS ONE WEEK BEFORE SURGERY.                       THIS INCLUDES BC POWDERS / GOODIES POWDER  __x___ 13. STOP Anti-inflammatories ONE WEEK BEFORE SURGERY.                       This includes IBUPROFEN / MOTRIN / ADVIL / ALEVE/ NAPROXYN                    YOU MAY TAKE TYLENOL ANY TIME PRIOR TO SURGERY.  ___X___18.  Wear clean and comfortable clothing to the hospital.  BRING PHONE NUMBERS FOR [facility].

## 2020-03-20 ENCOUNTER — Other Ambulatory Visit
Admission: RE | Admit: 2020-03-20 | Discharge: 2020-03-20 | Disposition: A | Discharge status: Home / Self Care | Payer: Medicare HMO | Source: Ambulatory Visit | Attending: Urology | Admitting: Urology

## 2020-03-20 ENCOUNTER — Other Ambulatory Visit: Payer: Self-pay

## 2020-03-20 DIAGNOSIS — Z01810 Encounter for preprocedural cardiovascular examination: Secondary | ICD-10-CM

## 2020-03-20 DIAGNOSIS — Z20822 Contact with and (suspected) exposure to covid-19: Secondary | ICD-10-CM | POA: Insufficient documentation

## 2020-03-20 DIAGNOSIS — Z01818 Encounter for other preprocedural examination: Secondary | ICD-10-CM | POA: Insufficient documentation

## 2020-03-20 LAB — CBC
HCT: 44.1 % (ref 39.0–52.0)
Hemoglobin: 15.3 g/dL (ref 13.0–17.0)
MCH: 30.9 pg (ref 26.0–34.0)
MCHC: 34.7 g/dL (ref 30.0–36.0)
MCV: 89.1 fL (ref 80.0–100.0)
Platelets: 210 10*3/uL (ref 150–400)
RBC: 4.95 MIL/uL (ref 4.22–5.81)
RDW: 13.1 % (ref 11.5–15.5)
WBC: 7.7 10*3/uL (ref 4.0–10.5)
nRBC: 0 % (ref 0.0–0.2)

## 2020-03-20 LAB — BASIC METABOLIC PANEL
Anion gap: 6 (ref 5–15)
BUN: 26 mg/dL — ABNORMAL HIGH (ref 8–23)
CO2: 24 mmol/L (ref 22–32)
Calcium: 8.7 mg/dL — ABNORMAL LOW (ref 8.9–10.3)
Chloride: 112 mmol/L — ABNORMAL HIGH (ref 98–111)
Creatinine, Ser: 0.88 mg/dL (ref 0.61–1.24)
GFR calc Af Amer: >60 mL/min (ref >60)
GFR calc non Af Amer: >60 mL/min (ref >60)
Glucose, Bld: 117 mg/dL — ABNORMAL HIGH (ref 70–99)
Potassium: 4.4 mmol/L (ref 3.5–5.1)
Sodium: 142 mmol/L (ref 135–145)

## 2020-03-21 ENCOUNTER — Other Ambulatory Visit: Payer: Medicare HMO

## 2020-03-21 LAB — SARS CORONAVIRUS 2 (TAT 6-24 HRS): SARS Coronavirus 2: NEGATIVE

## 2020-03-23 ENCOUNTER — Ambulatory Visit
Admission: RE | Admit: 2020-03-23 | Discharge: 2020-03-23 | Disposition: A | Discharge status: Home / Self Care | Payer: Medicare HMO | Source: Ambulatory Visit | Attending: Urology | Admitting: Urology

## 2020-03-23 ENCOUNTER — Ambulatory Visit: Payer: Medicare HMO | Admitting: Anesthesiology

## 2020-03-23 ENCOUNTER — Encounter
Admission: RE | Disposition: A | Discharge status: Home / Self Care | Payer: Self-pay | Source: Ambulatory Visit | Attending: Urology

## 2020-03-23 ENCOUNTER — Encounter: Payer: Self-pay | Admitting: Urology

## 2020-03-23 ENCOUNTER — Other Ambulatory Visit: Payer: Self-pay

## 2020-03-23 DIAGNOSIS — Z87891 Personal history of nicotine dependence: Secondary | ICD-10-CM | POA: Insufficient documentation

## 2020-03-23 DIAGNOSIS — Z87442 Personal history of urinary calculi: Secondary | ICD-10-CM | POA: Insufficient documentation

## 2020-03-23 DIAGNOSIS — Z8546 Personal history of malignant neoplasm of prostate: Secondary | ICD-10-CM | POA: Insufficient documentation

## 2020-03-23 DIAGNOSIS — C672 Malignant neoplasm of lateral wall of bladder: Secondary | ICD-10-CM | POA: Diagnosis not present

## 2020-03-23 DIAGNOSIS — C678 Malignant neoplasm of overlapping sites of bladder: Secondary | ICD-10-CM

## 2020-03-23 DIAGNOSIS — Z79899 Other long term (current) drug therapy: Secondary | ICD-10-CM | POA: Diagnosis not present

## 2020-03-23 DIAGNOSIS — C679 Malignant neoplasm of bladder, unspecified: Secondary | ICD-10-CM | POA: Diagnosis present

## 2020-03-23 DIAGNOSIS — N3000 Acute cystitis without hematuria: Secondary | ICD-10-CM | POA: Diagnosis not present

## 2020-03-23 HISTORY — PX: TRANSURETHRAL RESECTION OF BLADDER TUMOR WITH MITOMYCIN-C: SHX6459

## 2020-03-23 SURGERY — TRANSURETHRAL RESECTION OF BLADDER TUMOR WITH MITOMYCIN-C
Anesthesia: General

## 2020-03-23 MED ORDER — FENTANYL CITRATE (PF) 100 MCG/2ML IJ SOLN
INTRAMUSCULAR | Status: AC
  Administered 2020-03-23: 25 ug via INTRAVENOUS
  Filled 2020-03-23: qty 2

## 2020-03-23 MED ORDER — CHLORHEXIDINE GLUCONATE 0.12 % MT SOLN: 15.0000 mL | Freq: Once | OROMUCOSAL | Status: AC

## 2020-03-23 MED ORDER — OXYCODONE HCL 5 MG PO TABS: 5.0000 mg | ORAL_TABLET | Freq: Once | ORAL | Status: DC | PRN

## 2020-03-23 MED ORDER — FAMOTIDINE 20 MG PO TABS: 20.0000 mg | ORAL_TABLET | Freq: Once | ORAL | Status: AC

## 2020-03-23 MED ORDER — CEFAZOLIN SODIUM-DEXTROSE 2-4 GM/100ML-% IV SOLN
INTRAVENOUS | Status: AC
  Filled 2020-03-23: qty 100

## 2020-03-23 MED ORDER — FENTANYL CITRATE (PF) 100 MCG/2ML IJ SOLN
INTRAMUSCULAR | Status: AC
  Filled 2020-03-23: qty 2

## 2020-03-23 MED ORDER — LACTATED RINGERS IV SOLN: INTRAVENOUS | Status: DC

## 2020-03-23 MED ORDER — CHLORHEXIDINE GLUCONATE 0.12 % MT SOLN
OROMUCOSAL | Status: AC
  Administered 2020-03-23: 15 mL via OROMUCOSAL
  Filled 2020-03-23: qty 15

## 2020-03-23 MED ORDER — DEXAMETHASONE SODIUM PHOSPHATE 10 MG/ML IJ SOLN
INTRAMUSCULAR | Status: DC | PRN
  Administered 2020-03-23: 5 mg via INTRAVENOUS

## 2020-03-23 MED ORDER — PROPOFOL 10 MG/ML IV BOLUS
INTRAVENOUS | Status: AC
  Filled 2020-03-23: qty 20

## 2020-03-23 MED ORDER — MIDAZOLAM HCL 2 MG/2ML IJ SOLN
INTRAMUSCULAR | Status: AC
  Filled 2020-03-23: qty 2

## 2020-03-23 MED ORDER — GEMCITABINE CHEMO FOR BLADDER INSTILLATION 2000 MG
INTRAVENOUS | Status: DC | PRN
  Administered 2020-03-23: 2000 mg via INTRAVESICAL

## 2020-03-23 MED ORDER — ACETAMINOPHEN 10 MG/ML IV SOLN: 1000.0000 mg | Freq: Once | INTRAVENOUS | Status: DC | PRN

## 2020-03-23 MED ORDER — FENTANYL CITRATE (PF) 100 MCG/2ML IJ SOLN
25.0000 ug | INTRAMUSCULAR | Status: DC | PRN
  Administered 2020-03-23 (×3): 25 ug via INTRAVENOUS

## 2020-03-23 MED ORDER — ONDANSETRON HCL 4 MG/2ML IJ SOLN
INTRAMUSCULAR | Status: DC | PRN
  Administered 2020-03-23: 4 mg via INTRAVENOUS

## 2020-03-23 MED ORDER — ONDANSETRON HCL 4 MG/2ML IJ SOLN: 4.0000 mg | Freq: Once | INTRAMUSCULAR | Status: DC | PRN

## 2020-03-23 MED ORDER — PROPOFOL 10 MG/ML IV BOLUS
INTRAVENOUS | Status: DC | PRN
  Administered 2020-03-23: 200 mg via INTRAVENOUS

## 2020-03-23 MED ORDER — FAMOTIDINE 20 MG PO TABS
ORAL_TABLET | ORAL | Status: AC
  Administered 2020-03-23: 20 mg via ORAL
  Filled 2020-03-23: qty 1

## 2020-03-23 MED ORDER — MIDAZOLAM HCL 2 MG/2ML IJ SOLN
INTRAMUSCULAR | Status: DC | PRN
  Administered 2020-03-23: 2 mg via INTRAVENOUS

## 2020-03-23 MED ORDER — OXYCODONE HCL 5 MG/5ML PO SOLN: 5.0000 mg | Freq: Once | ORAL | Status: DC | PRN

## 2020-03-23 MED ORDER — FENTANYL CITRATE (PF) 100 MCG/2ML IJ SOLN
INTRAMUSCULAR | Status: DC | PRN
  Administered 2020-03-23 (×2): 50 ug via INTRAVENOUS
  Administered 2020-03-23 (×2): 25 ug via INTRAVENOUS

## 2020-03-23 MED ORDER — ORAL CARE MOUTH RINSE: 15.0000 mL | Freq: Once | OROMUCOSAL | Status: AC

## 2020-03-23 MED ORDER — CEFAZOLIN SODIUM-DEXTROSE 2-4 GM/100ML-% IV SOLN
2.0000 g | INTRAVENOUS | Status: AC
  Administered 2020-03-23: 2 g via INTRAVENOUS

## 2020-03-23 SURGICAL SUPPLY — 27 items
BAG DRAIN CYSTO-URO LG1000N (MISCELLANEOUS) ×2 IMPLANT
BAG URINE DRAIN 2000ML AR STRL (UROLOGICAL SUPPLIES) ×2 IMPLANT
BRUSH SCRUB EZ  4% CHG (MISCELLANEOUS) ×1
BRUSH SCRUB EZ 4% CHG (MISCELLANEOUS) ×1 IMPLANT
CATH FOLEY 2WAY  5CC 16FR (CATHETERS) ×1
CATH URTH 16FR FL 2W BLN LF (CATHETERS) ×1 IMPLANT
CRADLE LAMINECT ARM (MISCELLANEOUS) ×2 IMPLANT
DRAPE UTILITY 15X26 TOWEL STRL (DRAPES) ×2 IMPLANT
DRSG TELFA 4X3 1S NADH ST (GAUZE/BANDAGES/DRESSINGS) ×2 IMPLANT
ELECT LOOP 22F BIPOLAR SML (ELECTROSURGICAL)
ELECT REM PT RETURN 9FT ADLT (ELECTROSURGICAL) ×2
ELECTRODE LOOP 22F BIPOLAR SML (ELECTROSURGICAL) IMPLANT
ELECTRODE REM PT RTRN 9FT ADLT (ELECTROSURGICAL) IMPLANT
GLOVE BIO SURGEON STRL SZ 6.5 (GLOVE) ×2 IMPLANT
GOWN STRL REUS W/ TWL LRG LVL3 (GOWN DISPOSABLE) ×2 IMPLANT
GOWN STRL REUS W/TWL LRG LVL3 (GOWN DISPOSABLE) ×2
JELLY LUB 2OZ STRL (MISCELLANEOUS) ×1
JELLY LUBE 2OZ STRL (MISCELLANEOUS) IMPLANT
KIT TURNOVER CYSTO (KITS) ×2 IMPLANT
LOOP CUT BIPOLAR 24F LRG (ELECTROSURGICAL) ×1 IMPLANT
NDL SAFETY ECLIPSE 18X1.5 (NEEDLE) ×1 IMPLANT
NEEDLE HYPO 18GX1.5 SHARP (NEEDLE) ×1
PACK CYSTO AR (MISCELLANEOUS) ×2 IMPLANT
SET IRRIG Y TYPE TUR BLADDER L (SET/KITS/TRAYS/PACK) ×2 IMPLANT
SURGILUBE 2OZ TUBE FLIPTOP (MISCELLANEOUS) ×2 IMPLANT
SYRINGE IRR TOOMEY STRL 70CC (SYRINGE) ×2 IMPLANT
WATER STERILE IRR 1000ML POUR (IV SOLUTION) ×2 IMPLANT

## 2020-03-23 NOTE — Anesthesia Procedure Notes (Signed)
Procedure Name: LMA Insertion Performed by: Fredderick Phenix, CRNA Pre-anesthesia Checklist: Patient identified, Emergency Drugs available, Suction available and Patient being monitored Patient Re-evaluated:Patient Re-evaluated prior to induction Oxygen Delivery Method: Circle system utilized Preoxygenation: Pre-oxygenation with 100% oxygen Induction Type: IV induction Ventilation: Mask ventilation without difficulty LMA: LMA inserted LMA Size: 5.0 Tube type: Oral Number of attempts: 1 Placement Confirmation: positive ETCO2 and breath sounds checked- equal and bilateral Tube secured with: Tape Dental Injury: Teeth and Oropharynx as per pre-operative assessment

## 2020-03-23 NOTE — Anesthesia Preprocedure Evaluation (Signed)
Anesthesia Evaluation  Patient identified by MRN, date of birth, ID band Patient awake    Reviewed: Allergy & Precautions, NPO status , Patient's Chart, lab work & pertinent test results  Airway Mallampati: II  TM Distance: >3 FB Neck ROM: Full    Dental  (+) Poor Dentition, Dental Advisory Given   Pulmonary neg shortness of breath, neg sleep apnea, neg recent URI, Patient abstained from smoking.Not current smoker, former smoker,    breath sounds clear to auscultation       Cardiovascular (-) angina(-) Past MI and (-) Cardiac Stents negative cardio ROS   Rhythm:Regular Rate:Normal     Neuro/Psych negative neurological ROS  negative psych ROS   GI/Hepatic Neg liver ROS, GERD  Controlled,  Endo/Other  BMI 35  Renal/GU Hx kidney stones     Musculoskeletal   Abdominal   Peds  Hematology negative hematology ROS (+)   Anesthesia Other Findings Hx prostate cancer  Reproductive/Obstetrics                             Anesthesia Physical  Anesthesia Plan  ASA: II  Anesthesia Plan: General   Post-op Pain Management:    Induction: Intravenous  PONV Risk Score and Plan: 3 and Ondansetron, Dexamethasone and Midazolam  Airway Management Planned: Oral ETT and LMA  Additional Equipment: None  Intra-op Plan:   Post-operative Plan: Extubation in OR  Informed Consent: I have reviewed the patients History and Physical, chart, labs and discussed the procedure including the risks, benefits and alternatives for the proposed anesthesia with the patient or authorized representative who has indicated his/her understanding and acceptance.     Dental advisory given  Plan Discussed with: CRNA  Anesthesia Plan Comments: (Discussed risks of anesthesia with patient, including PONV, sore throat, lip/dental damage. Rare risks discussed as well, such as cardiorespiratory and neurological sequelae. Patient  understands.)        Anesthesia Quick Evaluation

## 2020-03-23 NOTE — Discharge Instructions (Addendum)
Transurethral Resection of Bladder Tumor (TURBT) or Bladder Biopsy   Definition:  Transurethral Resection of the Bladder Tumor is a surgical procedure used to diagnose and remove tumors within the bladder. TURBT is the most common treatment for early stage bladder cancer.  General instructions:     Your recent bladder surgery requires very little post hospital care but some definite precautions.  Despite the fact that no skin incisions were used, the area around the bladder incisions are raw and covered with scabs to promote healing and prevent bleeding. Certain precautions are needed to insure that the scabs are not disturbed over the next 2-4 weeks while the healing proceeds.  Because the raw surface inside your bladder and the irritating effects of urine you may expect frequency of urination and/or urgency (a stronger desire to urinate) and perhaps even getting up at night more often. This will usually resolve or improve slowly over the healing period. You may see some blood in your urine over the first 6 weeks. Do not be alarmed, even if the urine was clear for a while. Get off your feet and drink lots of fluids until clearing occurs. If you start to pass clots or don't improve call us.  Diet:  You may return to your normal diet immediately. Because of the raw surface of your bladder, alcohol, spicy foods, foods high in acid and drinks with caffeine may cause irritation or frequency and should be used in moderation. To keep your urine flowing freely and avoid constipation, drink plenty of fluids during the day (8-10 glasses). Tip: Avoid cranberry juice because it is very acidic.  Activity:  Your physical activity doesn't need to be restricted. However, if you are very active, you may see some blood in the urine. We suggest that you reduce your activity under the circumstances until the bleeding has stopped.  Bowels:  It is important to keep your bowels regular during the postoperative  period. Straining with bowel movements can cause bleeding. A bowel movement every other day is reasonable. Use a mild laxative if needed, such as milk of magnesia 2-3 tablespoons, or 2 Dulcolax tablets. Call if you continue to have problems. If you had been taking narcotics for pain, before, during or after your surgery, you may be constipated. Take a laxative if necessary.    Medication:  You should resume your pre-surgery medications unless told not to. In addition you may be given an antibiotic to prevent or treat infection. Antibiotics are not always necessary. All medication should be taken as prescribed until the bottles are finished unless you are having an unusual reaction to one of the drugs.   [place], [place] [address] [phone]   AMBULATORY SURGERY  DISCHARGE INSTRUCTIONS   1) The drugs that you were given will stay in your system until tomorrow so for the next 24 hours you should not:  A) Drive an automobile B) Make any legal decisions C) Drink any alcoholic beverage   2) You may resume regular meals tomorrow.  Today it is better to start with liquids and gradually work up to solid foods.  You may eat anything you prefer, but it is better to start with liquids, then soup and crackers, and gradually work up to solid foods.   3) Please notify your doctor immediately if you have any unusual bleeding, trouble breathing, redness and pain at the surgery site, drainage, fever, or pain not relieved by medication.    4) Additional Instructions:  Contact  MD office if you have not emptied your bladder by this afternnon.        Please contact your physician with any problems or Same Day Surgery at [phone], Monday through Friday 6 am to 4 pm, or [place] at [facility] number at [phone].

## 2020-03-23 NOTE — Transfer of Care (Signed)
Immediate Anesthesia Transfer of Care Note  Patient: Nathan Holland  Procedure(s) Performed: TRANSURETHRAL RESECTION OF BLADDER TUMOR WITH Gemcitabine (N/A )  Patient Location: PACU  Anesthesia Type:General  Level of Consciousness: awake and alert   Airway & Oxygen Therapy: Patient Spontanous Breathing  Post-op Assessment: Report given to RN and Post -op Vital signs reviewed and stable  Post vital signs: Reviewed and stable  Last Vitals:  Vitals Value Taken Time  BP 91/54 03/23/20 0838  Temp 35.9 C 03/23/20 0838  Pulse 69 03/23/20 0839  Resp 11 03/23/20 0839  SpO2 97 % 03/23/20 0839  Vitals shown include unvalidated device data.  Last Pain:  Vitals:   03/23/20 0622  PainSc: 0-No pain         Complications: No complications documented.

## 2020-03-23 NOTE — Anesthesia Postprocedure Evaluation (Signed)
Anesthesia Post Note  Patient: Tarry Fountain Adamski  Procedure(s) Performed: TRANSURETHRAL RESECTION OF BLADDER TUMOR WITH Gemcitabine (N/A )  Patient location during evaluation: PACU Anesthesia Type: General Level of consciousness: awake and alert Pain management: pain level controlled Vital Signs Assessment: post-procedure vital signs reviewed and stable Respiratory status: spontaneous breathing, nonlabored ventilation, respiratory function stable and patient connected to nasal cannula oxygen Cardiovascular status: blood pressure returned to baseline and stable Postop Assessment: no apparent nausea or vomiting Anesthetic complications: no   No complications documented.   Last Vitals:  Vitals:   03/23/20 0901 03/23/20 0908  BP:  128/80  Pulse: 62 60  Resp: 14 12  Temp:    SpO2: 93% 93%    Last Pain:  Vitals:   03/23/20 0908  PainSc: 5                  Arita Miss

## 2020-03-23 NOTE — Op Note (Signed)
Date of procedure: 03/23/20  Preoperative diagnosis:  1. Bladder cancer  Postoperative diagnosis:  1. Same as above  Procedure: 1. TURBT, medium 2. Instillation of intravesical chemotherapy  Surgeon: Hollice Espy, MD  Anesthesia: General  Complications: None  Intraoperative findings: Two areas on the left lateral bladder wall just beyond the left trigone on the left lateral bladder wall, 1 measuring approximately 1 cm the other measuring approximately 1.5 cm with overlying shaggy necrosis and slight surrounding erythema.  No obvious viable residual tumor appreciated.  EBL: minimal   Specimens: bladder tumor  Drains: 16 Fr foley  Indication: Nathan Holland is a 64 y.o. patient with high-grade T1 and CIS who returns for reresection per AUA guidelines.  After reviewing the management options for treatment, he elected to proceed with the above surgical procedure(s). We have discussed the potential benefits and risks of the procedure, side effects of the proposed treatment, the likelihood of the patient achieving the goals of the procedure, and any potential problems that might occur during the procedure or recuperation. Informed consent has been obtained.  Description of procedure:  The patient was taken to the operating room and general anesthesia was induced.  The patient was placed in the dorsal lithotomy position, prepped and draped in the usual sterile fashion, and preoperative antibiotics were administered. A preoperative time-out was performed.   A 26 French resectoscope was advanced per urethra using a visual obturator.  Notably, he had a mild bulbar urethral stricture which was easily passable.  Upon entering into the bladder, 2 areas just beyond the left hemitrigone left lateral bladder wall were appreciated, the more medial lesion measuring about 1 cm and beyond this a lesion measuring approximately 1.5 cm.  Each of these areas had a somewhat flat punched-out appearance  with surrounding erythema and shaggy necrosis overlying.  I used the bipolar loop to loosen the adherent necrotic material overlying the areas.  I then used the cold cup biopsy forcep to take deeper samples where there is still viable tissue in the bed of each of the lesions which were passed off the field.  I then used a bipolar loop to take both lesion beds all the way down to pink tissue which is felt to be consistent with muscularis propria.  Once the bases of each of the TUR beds were completely resected, careful and adequate hemostasis was achieved using the bipolar loop.  All chips were passed off the field in addition to the cold cup forceps.  Some of his material was also the shaggy necrosis.  At this point time, the chips were evacuated.  Hemostasis was adequate.  The scope was then removed.  55 French Foley catheter was then placed using standard sterile technique.  The balloon was filled with 10 cc.  The patient was then cleaned and dried, repositioned supine position taken to the PACU in stable condition.  2000 mg of intravesical gemcitabine was instilled into the bladder.  This was allowed to dwell for 1 hour which was well-tolerated.  After an hour, the chemotherapy was drained and the Foley was removed.  Plan: Patient will follow up for BCG in about 4 to 6 weeks for induction BCG.  All questions about BCG were answered prior to the procedure today.  He is also concerned about being able to hold the medication in his bladder due to urge incontinence, advised to pick up samples of Myrbetriq to help with this.  Hollice Espy, M.D.

## 2020-03-23 NOTE — Interval H&P Note (Signed)
History and Physical Interval Note:  03/23/2020 7:24 AM  Nathan Holland  has presented today for surgery, with the diagnosis of bladder cancer.  The various methods of treatment have been discussed with the patient and family. After consideration of risks, benefits and other options for treatment, the patient has consented to  Procedure(s): TRANSURETHRAL RESECTION OF BLADDER TUMOR WITH Gemcitabine (N/A) as a surgical intervention.  The patient's history has been reviewed, patient examined, no change in status, stable for surgery.  I have reviewed the patient's chart and labs.  Questions were answered to the patient's satisfaction.    RRR CTAB  Hollice Espy

## 2020-03-23 NOTE — OR Nursing (Signed)
Patient doesn't have to void prior to discharge per Dr. Erlene Quan secure chat.  Instruct patient to contact office if any issues with voiding by this afternoon.

## 2020-03-24 ENCOUNTER — Encounter: Payer: Self-pay | Admitting: Urology

## 2020-03-27 LAB — SURGICAL PATHOLOGY

## 2020-03-28 ENCOUNTER — Telehealth: Payer: Self-pay | Admitting: *Deleted

## 2020-03-28 NOTE — Telephone Encounter (Addendum)
Patient informed, voiced understanding.  ----- Message from Hollice Espy, MD sent at 03/28/2020  8:36 AM EDT ----- There was no residual tumor on this most recent biopsy.  That is awesome news.  We will plan to start your BCG as scheduled.  Hollice Espy, MD

## 2020-04-27 ENCOUNTER — Telehealth: Payer: Self-pay

## 2020-04-27 NOTE — Telephone Encounter (Signed)
Called patient and scheduled him for his BCG treatment induction. Detailed instructions were given to him and questions were answered. Patient verbalized understanding

## 2020-05-05 ENCOUNTER — Ambulatory Visit (INDEPENDENT_AMBULATORY_CARE_PROVIDER_SITE_OTHER): Payer: Medicare HMO | Admitting: Physician Assistant

## 2020-05-05 ENCOUNTER — Other Ambulatory Visit: Payer: Self-pay

## 2020-05-05 DIAGNOSIS — C679 Malignant neoplasm of bladder, unspecified: Secondary | ICD-10-CM | POA: Diagnosis not present

## 2020-05-05 LAB — URINALYSIS, COMPLETE
Bilirubin, UA: NEGATIVE
Glucose, UA: NEGATIVE
Ketones, UA: NEGATIVE
Nitrite, UA: NEGATIVE
Specific Gravity, UA: >1.03 — ABNORMAL HIGH (ref 1.005–1.030)
Urobilinogen, Ur: 0.2 mg/dL (ref 0.2–1.0)
pH, UA: 5 (ref 5.0–7.5)

## 2020-05-05 LAB — MICROSCOPIC EXAMINATION: Bacteria, UA: NONE SEEN

## 2020-05-05 MED ORDER — BCG LIVE 50 MG IS SUSR
3.2400 mL | Freq: Once | INTRAVESICAL | Status: AC
  Administered 2020-05-05: 81 mg via INTRAVESICAL

## 2020-05-05 NOTE — Patient Instructions (Signed)

## 2020-05-05 NOTE — Progress Notes (Signed)
BCG Bladder Instillation  BCG # 1 of 6  Due to Bladder Cancer patient is present today for a BCG treatment. Patient was cleaned and prepped in a sterile fashion with betadine. A 14FR catheter was inserted, urine return was noted 81ml, urine was yellow in color.  74ml of reconstituted BCG was instilled into the bladder. The catheter was then removed. Patient tolerated well, no complications were noted  Performed by: Debroah Loop, PA-C and Verlene Mayer, CMA  Follow up/ Additional notes: Kept patient for 30 minutes after instillation to monitor for hypersensitivity reaction; none noted. Counseled patient on post-instillation instructions including quarter turns, holding urine for 2 hours, and bleaching the toilet for 6 hours thereafter. He expressed understanding. RTC 1 week for BCG #2 of 6.

## 2020-05-12 ENCOUNTER — Ambulatory Visit: Payer: Medicare HMO

## 2020-05-12 ENCOUNTER — Other Ambulatory Visit: Payer: Self-pay

## 2020-05-12 DIAGNOSIS — C679 Malignant neoplasm of bladder, unspecified: Secondary | ICD-10-CM

## 2020-05-12 LAB — URINALYSIS, COMPLETE
Bilirubin, UA: NEGATIVE
Glucose, UA: NEGATIVE
Ketones, UA: NEGATIVE
Leukocytes,UA: NEGATIVE
Nitrite, UA: NEGATIVE
Specific Gravity, UA: >1.03 — ABNORMAL HIGH (ref 1.005–1.030)
Urobilinogen, Ur: 0.2 mg/dL (ref 0.2–1.0)
pH, UA: 5 (ref 5.0–7.5)

## 2020-05-12 LAB — MICROSCOPIC EXAMINATION: Bacteria, UA: NONE SEEN

## 2020-05-12 MED ORDER — BCG LIVE 50 MG IS SUSR
3.2400 mL | Freq: Once | INTRAVESICAL | Status: AC
  Administered 2020-05-12: 81 mg via INTRAVESICAL

## 2020-05-12 MED ORDER — MIRABEGRON ER 50 MG PO TB24: 50.0000 mg | ORAL_TABLET | Freq: Every day | ORAL | 6 refills | Status: DC

## 2020-05-12 NOTE — Progress Notes (Signed)
BCG Bladder Instillation  BCG # 2  Due to Bladder Cancer patient is present today for a BCG treatment. Patient was cleaned and prepped in a sterile fashion with betadine. A 14FR catheter was inserted, urine return was noted 57ml, urine was yellow in color. 45ml of reconstituted BCG was instilled into the bladder. The catheter was then removed. Patient tolerated well, no complications were noted.  Preformed by: Gordy Clement, [place], CMA  Follow up/ Additional notes: RTC as scheduled. Pt requests refills of Myrbetriq 50mg  sent into pharmacy as he was previously given samples.

## 2020-05-26 ENCOUNTER — Encounter: Payer: Self-pay | Admitting: Physician Assistant

## 2020-05-26 ENCOUNTER — Ambulatory Visit (INDEPENDENT_AMBULATORY_CARE_PROVIDER_SITE_OTHER): Payer: Medicare HMO | Admitting: Physician Assistant

## 2020-05-26 ENCOUNTER — Other Ambulatory Visit: Payer: Self-pay

## 2020-05-26 VITALS — BP 178/81 | HR 78

## 2020-05-26 DIAGNOSIS — C679 Malignant neoplasm of bladder, unspecified: Secondary | ICD-10-CM

## 2020-05-26 MED ORDER — SULFAMETHOXAZOLE-TRIMETHOPRIM 800-160 MG PO TABS: 1.0000 | ORAL_TABLET | Freq: Two times a day (BID) | ORAL | 0 refills | Status: AC

## 2020-05-26 NOTE — Patient Instructions (Signed)
Take Myrbetriq every day to treat your urinary frequency and urgency.  Start Bactrim antibiotics today. I will send your urine for culture and call you if I need to switch your antibiotics based on your results. We will see you next week for your next treatment.

## 2020-05-26 NOTE — Progress Notes (Signed)
Patient presented to clinic today for scheduled BCG instillation.  UA grossly infected.  Will defer treatment and start him on empiric Bactrim DS twice daily x7 days.  Urine sent for culture.  I counseled him that I would contact him if his culture results indicate a necessary change in therapy.  He expressed understanding.  He reports some urgency and frequency earlier this week.  He has run out of Myrbetriq samples and his prescription was cost prohibitive.  Additional Myrbetriq 50 mg samples provided today.

## 2020-05-29 LAB — URINALYSIS, COMPLETE
Bilirubin, UA: NEGATIVE
Glucose, UA: NEGATIVE
Ketones, UA: NEGATIVE
Nitrite, UA: POSITIVE — AB
Specific Gravity, UA: >1.03 — ABNORMAL HIGH (ref 1.005–1.030)
Urobilinogen, Ur: 0.2 mg/dL (ref 0.2–1.0)
pH, UA: 5.5 (ref 5.0–7.5)

## 2020-05-29 LAB — MICROSCOPIC EXAMINATION: WBC, UA: >30 /hpf — AB (ref 0–5)

## 2020-06-02 ENCOUNTER — Ambulatory Visit (INDEPENDENT_AMBULATORY_CARE_PROVIDER_SITE_OTHER): Payer: Medicare HMO | Admitting: Physician Assistant

## 2020-06-02 ENCOUNTER — Other Ambulatory Visit: Payer: Self-pay

## 2020-06-02 DIAGNOSIS — C679 Malignant neoplasm of bladder, unspecified: Secondary | ICD-10-CM

## 2020-06-02 LAB — MICROSCOPIC EXAMINATION: WBC, UA: >30 /hpf — AB (ref 0–5)

## 2020-06-02 LAB — URINALYSIS, COMPLETE
Bilirubin, UA: NEGATIVE
Glucose, UA: NEGATIVE
Ketones, UA: NEGATIVE
Nitrite, UA: POSITIVE — AB
Specific Gravity, UA: >1.03 — ABNORMAL HIGH (ref 1.005–1.030)
Urobilinogen, Ur: 1 mg/dL (ref 0.2–1.0)
pH, UA: 5.5 (ref 5.0–7.5)

## 2020-06-02 LAB — CULTURE, URINE COMPREHENSIVE

## 2020-06-02 MED ORDER — LEVOFLOXACIN 250 MG PO TABS: 250.0000 mg | ORAL_TABLET | Freq: Every day | ORAL | 0 refills | Status: AC

## 2020-06-02 NOTE — Progress Notes (Signed)
Patient presented to clinic today for scheduled BCG instillation.  UA remains grossly infected.  He reports he took his previously prescribed empiric Bactrim for approximately 4 days until he received a phone call from our office instructing him to stop it due to a negative urine culture result.  There are no phone notes regarding this and his urine culture only finalized today.  I apologized to the patient and explained that I do not know why he received a phone call instructing him to stop his antibiotics.  I am prescribing levofloxacin 250 mg daily x5 days for treatment per his most recent urine culture results and will send urine for culture again today.  I counseled him to continue this medication until and unless he hears specifically from me instructing him otherwise.  He expressed understanding.

## 2020-06-02 NOTE — Patient Instructions (Signed)
Your urine still looks infected today. I am prescribing another antibiotic. Please pick this up today and take it once daily for five days. Do not stop this medication unless you hear directly from me instructing you otherwise.

## 2020-06-05 ENCOUNTER — Telehealth: Payer: Self-pay | Admitting: Physician Assistant

## 2020-06-05 LAB — CULTURE, URINE COMPREHENSIVE

## 2020-06-05 MED ORDER — SULFAMETHOXAZOLE-TRIMETHOPRIM 800-160 MG PO TABS: 1.0000 | ORAL_TABLET | Freq: Two times a day (BID) | ORAL | 0 refills | Status: DC

## 2020-06-05 NOTE — Telephone Encounter (Signed)
I just spoke to the patient via telephone.  I explained that per his urine culture results, I would like to switch him from levofloxacin to Bactrim DS twice daily x7 days.  I have sent the prescription to his preferred pharmacy, [facility] [address].  He expressed understanding.

## 2020-06-09 ENCOUNTER — Other Ambulatory Visit: Payer: Self-pay

## 2020-06-09 ENCOUNTER — Ambulatory Visit (INDEPENDENT_AMBULATORY_CARE_PROVIDER_SITE_OTHER): Payer: Medicare HMO | Admitting: Physician Assistant

## 2020-06-09 DIAGNOSIS — D494 Neoplasm of unspecified behavior of bladder: Secondary | ICD-10-CM

## 2020-06-09 LAB — MICROSCOPIC EXAMINATION: Bacteria, UA: NONE SEEN

## 2020-06-09 LAB — URINALYSIS, COMPLETE
Bilirubin, UA: NEGATIVE
Glucose, UA: NEGATIVE
Ketones, UA: NEGATIVE
Leukocytes,UA: NEGATIVE
Nitrite, UA: NEGATIVE
RBC, UA: NEGATIVE
Specific Gravity, UA: >1.03 — ABNORMAL HIGH (ref 1.005–1.030)
Urobilinogen, Ur: 1 mg/dL (ref 0.2–1.0)
pH, UA: 5.5 (ref 5.0–7.5)

## 2020-06-09 MED ORDER — BCG LIVE 50 MG IS SUSR
3.2400 mL | Freq: Once | INTRAVESICAL | Status: AC
  Administered 2020-06-09: 81 mg via INTRAVESICAL

## 2020-06-09 NOTE — Patient Instructions (Signed)

## 2020-06-09 NOTE — Progress Notes (Signed)
BCG Bladder Instillation  BCG # 3 of 6  Due to Bladder Cancer patient is present today for a BCG treatment. Patient was cleaned and prepped in a sterile fashion with betadine. A 14FR catheter was inserted, urine return was noted 67ml, urine was yellow in color.  11ml of reconstituted BCG was instilled into the bladder. The catheter was then removed. Patient tolerated well, no complications were noted  Preformed by: Kerman Passey, RMA  Follow up/ Additional notes:  1 week

## 2020-06-16 ENCOUNTER — Encounter: Payer: Self-pay | Admitting: Physician Assistant

## 2020-06-16 ENCOUNTER — Other Ambulatory Visit: Payer: Self-pay

## 2020-06-16 ENCOUNTER — Ambulatory Visit (INDEPENDENT_AMBULATORY_CARE_PROVIDER_SITE_OTHER): Payer: Medicare HMO | Admitting: Physician Assistant

## 2020-06-16 VITALS — BP 162/97 | HR 45 | Ht 71.0 in | Wt 250.0 lb

## 2020-06-16 DIAGNOSIS — C679 Malignant neoplasm of bladder, unspecified: Secondary | ICD-10-CM

## 2020-06-16 LAB — URINALYSIS, COMPLETE
Bilirubin, UA: NEGATIVE
Glucose, UA: NEGATIVE
Ketones, UA: NEGATIVE
Leukocytes,UA: NEGATIVE
Nitrite, UA: NEGATIVE
Specific Gravity, UA: >1.03 — ABNORMAL HIGH (ref 1.005–1.030)
Urobilinogen, Ur: 1 mg/dL (ref 0.2–1.0)
pH, UA: 5.5 (ref 5.0–7.5)

## 2020-06-16 LAB — MICROSCOPIC EXAMINATION

## 2020-06-16 MED ORDER — BCG LIVE 50 MG IS SUSR
3.2400 mL | Freq: Once | INTRAVESICAL | Status: AC
  Administered 2020-06-16: 81 mg via INTRAVESICAL

## 2020-06-16 NOTE — Progress Notes (Signed)
BCG Bladder Instillation  BCG # 4 of 6  Due to Bladder Cancer patient is present today for a BCG treatment. Patient was cleaned and prepped in a sterile fashion with betadine. A 14FR coude catheter was inserted, urine return was noted 65m, urine was yellow in color.  550mof reconstituted BCG was instilled into the bladder. The catheter was then removed. Patient tolerated well, no complications were noted  Performed by: SaDebroah LoopPA-C   Follow up/ Additional notes: Resistance met at the level of the prostate with catheter insertion. Recommend 16Fr coude for next treatment. Patient reports some difficulty urinating immediately after his last treatment, already on Flomax with no orthostatic hypotension. Counseled patient to increase Flomax to 0.38m36maily today and tomorrow.

## 2020-06-16 NOTE — Patient Instructions (Signed)

## 2020-06-23 ENCOUNTER — Ambulatory Visit (INDEPENDENT_AMBULATORY_CARE_PROVIDER_SITE_OTHER): Payer: Medicare HMO | Admitting: Physician Assistant

## 2020-06-23 ENCOUNTER — Other Ambulatory Visit: Payer: Self-pay

## 2020-06-23 DIAGNOSIS — C679 Malignant neoplasm of bladder, unspecified: Secondary | ICD-10-CM

## 2020-06-23 MED ORDER — BCG LIVE 50 MG IS SUSR
3.2400 mL | Freq: Once | INTRAVESICAL | Status: AC
  Administered 2020-06-23: 81 mg via INTRAVESICAL

## 2020-06-23 NOTE — Progress Notes (Signed)
BCG Bladder Instillation  BCG # 5  Due to Bladder Cancer patient is present today for a BCG treatment. Patient was cleaned and prepped in a sterile fashion with betadine. A 16FR Coloplast SpeediCath Standard coude catheter was inserted, urine return was noted 67ml, urine was yellow in color.  29ml of reconstituted BCG was instilled into the bladder. The catheter was then removed. Patient tolerated well, no complications were noted  Performed by: Debroah Loop, PA-C and Fonnie Jarvis, CMA  Follow up/ Additional notes: 1 week for BCG #6 of 6

## 2020-06-26 LAB — MICROSCOPIC EXAMINATION: WBC, UA: >30 /hpf — AB (ref 0–5)

## 2020-06-26 LAB — URINALYSIS, COMPLETE
Bilirubin, UA: NEGATIVE
Glucose, UA: NEGATIVE
Ketones, UA: NEGATIVE
Nitrite, UA: NEGATIVE
Specific Gravity, UA: 1.025 (ref 1.005–1.030)
Urobilinogen, Ur: 1 mg/dL (ref 0.2–1.0)
pH, UA: 7 (ref 5.0–7.5)

## 2020-06-28 ENCOUNTER — Telehealth: Payer: Self-pay | Admitting: *Deleted

## 2020-06-28 NOTE — Telephone Encounter (Signed)
Talked to sam about taking two flomax daily. Sam approved it. Advised patient if he starts feeling dizzness to stop taking two .

## 2020-06-30 ENCOUNTER — Ambulatory Visit (INDEPENDENT_AMBULATORY_CARE_PROVIDER_SITE_OTHER): Payer: Medicare HMO | Admitting: Physician Assistant

## 2020-06-30 ENCOUNTER — Other Ambulatory Visit: Payer: Self-pay

## 2020-06-30 DIAGNOSIS — C679 Malignant neoplasm of bladder, unspecified: Secondary | ICD-10-CM

## 2020-06-30 LAB — URINALYSIS, COMPLETE
Bilirubin, UA: NEGATIVE
Glucose, UA: NEGATIVE
Ketones, UA: NEGATIVE
Nitrite, UA: NEGATIVE
Specific Gravity, UA: >1.03 — ABNORMAL HIGH (ref 1.005–1.030)
Urobilinogen, Ur: 0.2 mg/dL (ref 0.2–1.0)
pH, UA: 5 (ref 5.0–7.5)

## 2020-06-30 LAB — MICROSCOPIC EXAMINATION: Bacteria, UA: NONE SEEN

## 2020-06-30 MED ORDER — BCG LIVE 50 MG IS SUSR
3.2400 mL | Freq: Once | INTRAVESICAL | Status: AC
  Administered 2020-06-30: 81 mg via INTRAVESICAL

## 2020-06-30 NOTE — Progress Notes (Signed)
BCG Bladder Instillation  BCG # 6 of 6  Due to Bladder Cancer patient is present today for a BCG treatment. Patient was cleaned and prepped in a sterile fashion with betadine. A 14FR coude catheter was inserted, urine return was noted 164ml, urine was yellow in color.  28ml of reconstituted BCG was instilled into the bladder. The catheter was then removed. Patient tolerated well, no complications were noted  Performed by: Debroah Loop, PA-C and Bradly Bienenstock, CMA  Additional notes: Counseled patient to stop Myrbetriq and reduce Flomax to 0.4mg  daily on 9/29.  Follow up: 3 months for cystoscopy with Dr. Erlene Quan

## 2020-07-21 ENCOUNTER — Ambulatory Visit: Payer: Self-pay | Admitting: Physician Assistant

## 2020-08-25 ENCOUNTER — Ambulatory Visit: Payer: Self-pay | Admitting: Physician Assistant

## 2020-08-30 ENCOUNTER — Other Ambulatory Visit: Payer: Self-pay | Admitting: Urology

## 2020-10-10 ENCOUNTER — Other Ambulatory Visit: Payer: Self-pay | Admitting: Urology

## 2020-10-10 DIAGNOSIS — J40 Bronchitis, not specified as acute or chronic: Secondary | ICD-10-CM | POA: Diagnosis not present

## 2020-10-12 ENCOUNTER — Telehealth: Payer: Self-pay | Admitting: Urology

## 2020-10-12 NOTE — Telephone Encounter (Signed)
Mr. Nathan Holland was a no-show earlier this week for his follow-up surveillance cystoscopy.  Please reach out to him and kindly remind him to reschedule this.  Vanna Scotland, MD

## 2020-10-13 ENCOUNTER — Encounter: Payer: Self-pay | Admitting: Urology

## 2020-10-16 NOTE — Telephone Encounter (Signed)
Patient rescheduled, forgot about appointment.

## 2020-10-25 ENCOUNTER — Ambulatory Visit: Payer: Medicare HMO | Admitting: Urology

## 2020-10-25 ENCOUNTER — Other Ambulatory Visit: Payer: Self-pay

## 2020-10-25 VITALS — BP 150/82 | HR 85 | Ht 71.0 in | Wt 250.0 lb

## 2020-10-25 DIAGNOSIS — N3000 Acute cystitis without hematuria: Secondary | ICD-10-CM

## 2020-10-25 DIAGNOSIS — C679 Malignant neoplasm of bladder, unspecified: Secondary | ICD-10-CM

## 2020-10-25 DIAGNOSIS — R300 Dysuria: Secondary | ICD-10-CM | POA: Diagnosis not present

## 2020-10-25 MED ORDER — SULFAMETHOXAZOLE-TRIMETHOPRIM 800-160 MG PO TABS
1.0000 | ORAL_TABLET | Freq: Two times a day (BID) | ORAL | 0 refills | Status: DC
Start: 2020-10-25 — End: 2020-11-08

## 2020-10-25 NOTE — Progress Notes (Signed)
10/25/2020 3:22 PM   Nathan Holland 01-07-1956 [id number]  Referring provider: Donnamarie Rossetti, PA-C [place],  [place] [id number]   HPI: 65 year old male who has a personal history of bladder cancer status post induction BCG who returns today for office cystoscopy.  Preprocedure urinalysis today indicates 11-30 WBCs, calcium oxalate crystals moderate bacteria nitrate positive concerning for possible infection.  He does endorse that he has severe urinary urgency and some back pain.  He denies any dysuria or gross hematuria.  No fevers or chills.   PMH: Past Medical History:  Diagnosis Date  . Bladder cancer ([facility] [place]) 2021   TURBT with Gemcitabine  . GERD (gastroesophageal reflux disease)   . History of kidney stones 2011,2012   previous lithotripsies  . Prostate cancer [facility]) 2012   prostate with seed placement    Surgical History: Past Surgical History:  Procedure Laterality Date  . ETHMOIDECTOMY Bilateral 03/31/2018   Procedure: ETHMOIDECTOMY  WITH FRONTAL EXPLORATION;  Surgeon: Clyde Canterbury, MD;  Location: [place];  Service: ENT;  Laterality: Bilateral;  . EXTRACORPOREAL SHOCK WAVE LITHOTRIPSY  2011,2012  . EXTRACORPOREAL SHOCK WAVE LITHOTRIPSY Left 01/02/2017   Procedure: EXTRACORPOREAL SHOCK WAVE LITHOTRIPSY (ESWL);  Surgeon: Royston Cowper, MD;  Location: [facility];  Service: Urology;  Laterality: Left;  . IMAGE GUIDED SINUS SURGERY Bilateral 03/31/2018   Procedure: IMAGE GUIDED SINUS SURGERY;  Surgeon: Clyde Canterbury, MD;  Location: [place];  Service: ENT;  Laterality: Bilateral;  NEED STRYKER DISK GAVE STRYKER DISK TO CECE 6-11  KP  . INSERTION PROSTATE RADIATION SEED  2012  . MAXILLARY ANTROSTOMY Bilateral 03/31/2018   Procedure: MAXILLARY ANTROSTOMY WITH TISSUE REMOVAL;  Surgeon: Clyde Canterbury, MD;  Location: [place];  Service: ENT;  Laterality: Bilateral;  . TRANSURETHRAL RESECTION OF BLADDER TUMOR WITH  MITOMYCIN-C N/A 02/24/2020   Procedure: TRANSURETHRAL RESECTION OF BLADDER TUMOR WITH Gemcitabine;  Surgeon: Hollice Espy, MD;  Location: [facility];  Service: Urology;  Laterality: N/A;  . TRANSURETHRAL RESECTION OF BLADDER TUMOR WITH MITOMYCIN-C N/A 03/23/2020   Procedure: TRANSURETHRAL RESECTION OF BLADDER TUMOR WITH Gemcitabine;  Surgeon: Hollice Espy, MD;  Location: [facility];  Service: Urology;  Laterality: N/A;    Home Medications:  Allergies as of 10/25/2020   No Known Allergies     Medication List       Accurate as of October 25, 2020  3:22 PM. If you have any questions, ask your nurse or doctor.        STOP taking these medications   docusate sodium 100 MG capsule Commonly known as: COLACE Stopped by: Hollice Espy, MD   HYDROcodone-acetaminophen 5-325 MG tablet Commonly known as: NORCO/VICODIN Stopped by: Hollice Espy, MD   mirabegron ER 50 MG Tb24 tablet Commonly known as: MYRBETRIQ Stopped by: Hollice Espy, MD   oxybutynin 5 MG tablet Commonly known as: DITROPAN Stopped by: Hollice Espy, MD     TAKE these medications   sulfamethoxazole-trimethoprim 800-160 MG tablet Commonly known as: BACTRIM DS Take 1 tablet by mouth every 12 (twelve) hours. Started by: Hollice Espy, MD   tamsulosin 0.4 MG Caps capsule Commonly known as: FLOMAX Take 0.4 mg by mouth at bedtime.       Allergies: No Known Allergies  Family History: No family history on file.  Social History:  reports that he quit smoking about 14 years ago. He smoked 1.00 pack per day. He has never used smokeless tobacco. He reports that he does not drink alcohol  and does not use drugs.   Physical Exam: BP (!) 150/82   Pulse 85   Ht 5\' 11"  (1.803 m)   Wt 250 lb (113.4 kg)   BMI 34.87 kg/m   Constitutional:  Alert and oriented, No acute distress. HEENT: [place] AT, moist mucus membranes.  Trachea midline, no masses. Cardiovascular: No clubbing, cyanosis, or edema. Respiratory: Normal  respiratory effort, no increased work of breathing. Skin: No rashes, bruises or suspicious lesions. Neurologic: Grossly intact, no focal deficits, moving all 4 extremities. Psychiatric: Normal mood and affect.   Assessment & Plan:    1. Malignant neoplasm of urinary bladder, unspecified site ([place]) In light of possible UTI, will defer cystoscopy and reschedule for in 2 weeks - Urinalysis, Complete  2. Acute cystitis without hematuria Urinalysis highly suspicious for infection, will treat with Bactrim DS twice daily for 7 days - CULTURE, URINE COMPREHENSIVE  3.  Urinary urgency Acute on chronic, worsening We will treat infection and see if this resolves, previously took Myrbetriq during BCG treatments, may consider resuming this if he fails to improve   Hollice Espy, MD  [place] [address] [place], [place] [address] [phone]

## 2020-10-26 LAB — URINALYSIS, COMPLETE
Bilirubin, UA: NEGATIVE
Glucose, UA: NEGATIVE
Ketones, UA: NEGATIVE
Nitrite, UA: POSITIVE — AB
Protein,UA: NEGATIVE
RBC, UA: NEGATIVE
Specific Gravity, UA: 1.025 (ref 1.005–1.030)
Urobilinogen, Ur: 0.2 mg/dL (ref 0.2–1.0)
pH, UA: 5 (ref 5.0–7.5)

## 2020-10-26 LAB — MICROSCOPIC EXAMINATION

## 2020-11-02 LAB — CULTURE, URINE COMPREHENSIVE

## 2020-11-03 DIAGNOSIS — M25561 Pain in right knee: Secondary | ICD-10-CM | POA: Diagnosis not present

## 2020-11-08 ENCOUNTER — Other Ambulatory Visit: Payer: Self-pay

## 2020-11-08 ENCOUNTER — Ambulatory Visit: Payer: Medicare HMO | Admitting: Urology

## 2020-11-08 ENCOUNTER — Other Ambulatory Visit: Payer: Self-pay | Admitting: Radiology

## 2020-11-08 ENCOUNTER — Encounter: Payer: Self-pay | Admitting: Urology

## 2020-11-08 VITALS — BP 160/51 | HR 71 | Ht 71.0 in | Wt 256.0 lb

## 2020-11-08 DIAGNOSIS — N429 Disorder of prostate, unspecified: Secondary | ICD-10-CM

## 2020-11-08 DIAGNOSIS — C679 Malignant neoplasm of bladder, unspecified: Secondary | ICD-10-CM | POA: Diagnosis not present

## 2020-11-08 DIAGNOSIS — Z8551 Personal history of malignant neoplasm of bladder: Secondary | ICD-10-CM

## 2020-11-08 DIAGNOSIS — N35919 Unspecified urethral stricture, male, unspecified site: Secondary | ICD-10-CM

## 2020-11-08 DIAGNOSIS — R339 Retention of urine, unspecified: Secondary | ICD-10-CM | POA: Diagnosis not present

## 2020-11-08 NOTE — Progress Notes (Unsigned)
   11/08/20  CC:  Chief Complaint  Patient presents with  . Cysto    HPI:  65 year old male with a history of high-grade T1/CIS status post induction BCG returns today for office cystoscopy/surveillance.   Blood pressure (!) 160/51, pulse 71, height 5\' 11"  (1.803 m), weight 256 lb (116.1 kg). NED. A&Ox3.   No respiratory distress   Abd soft, NT, ND Normal phallus with bilateral descended testicles  Cystoscopy Procedure Note  Patient identification was confirmed, informed consent was obtained, and patient was prepped using Betadine solution.  Lidocaine jelly was administered per urethral meatus.     Pre-Procedure: - Inspection reveals a normal caliber ureteral meatus.  Procedure: The flexible cystoscope was introduced without difficulty - No urethral strictures/lesions are present. - Irregular prostatic fossa closest to apex, nodular requiring wire to bypass this area - Normal bladder neck - Bilateral ureteral orifices identified - Bladder mucosa  reveals no ulcers, tumors, or lesions other than adherent calcifications on the left lateral bladder wall, likely dystrophic calcifications at resection site without any obvious  Underlying tumor.  Mild debris limiting complete evaluation. - No bladder stones  Retroflexion unremarkable  Post-Procedure: - Patient tolerated the procedure well   Assessment/ Plan:  1. Malignant neoplasm of urinary bladder, unspecified site ([facility]) No obvious evidence of malignancy within the bladder, dystrophic calcifications appreciated  Urine cytology today - Urinalysis, Complete - CULTURE, URINE COMPREHENSIVE - Cytology - Non PAP;  2. Irregular prostate Nodular, irregular portion of the prostate with some stenosis at the apex which was not previously appreciated  Given his history of high risk bladder cancer CIS, I do feel that biopsying this area is warranted.  Also be able to take a better look in his bladder intraoperatively at the time  of biopsy as his urine was slightly hazy today and visualization was reasonable but not most optimal.  Also proceed with bilateral retrograde pyelogram at the same time.  We discussed the need for possible Foley catheterization postop.  Additional risk include risk of bleeding, infection, demonstrating structures all discussed.  All questions answered.  He is agreeable this plan.    Hollice Espy, MD

## 2020-11-09 ENCOUNTER — Ambulatory Visit (INDEPENDENT_AMBULATORY_CARE_PROVIDER_SITE_OTHER): Payer: Medicare HMO | Admitting: Urology

## 2020-11-09 VITALS — BP 174/76 | HR 85 | Wt 256.0 lb

## 2020-11-09 DIAGNOSIS — R39198 Other difficulties with micturition: Secondary | ICD-10-CM | POA: Diagnosis not present

## 2020-11-09 DIAGNOSIS — C61 Malignant neoplasm of prostate: Secondary | ICD-10-CM | POA: Diagnosis not present

## 2020-11-09 DIAGNOSIS — R3915 Urgency of urination: Secondary | ICD-10-CM | POA: Diagnosis not present

## 2020-11-09 DIAGNOSIS — R339 Retention of urine, unspecified: Secondary | ICD-10-CM | POA: Diagnosis not present

## 2020-11-09 DIAGNOSIS — C679 Malignant neoplasm of bladder, unspecified: Secondary | ICD-10-CM | POA: Diagnosis not present

## 2020-11-09 LAB — URINALYSIS, COMPLETE
Bilirubin, UA: NEGATIVE
Bilirubin, UA: NEGATIVE
Glucose, UA: NEGATIVE
Glucose, UA: NEGATIVE
Ketones, UA: NEGATIVE
Ketones, UA: NEGATIVE
Nitrite, UA: NEGATIVE
Nitrite, UA: POSITIVE — AB
Protein,UA: NEGATIVE
Protein,UA: NEGATIVE
RBC, UA: NEGATIVE
Specific Gravity, UA: 1.025 (ref 1.005–1.030)
Specific Gravity, UA: 1.02 (ref 1.005–1.030)
Urobilinogen, Ur: 0.2 mg/dL (ref 0.2–1.0)
Urobilinogen, Ur: 0.2 mg/dL (ref 0.2–1.0)
pH, UA: 6 (ref 5.0–7.5)
pH, UA: 7 (ref 5.0–7.5)

## 2020-11-09 LAB — MICROSCOPIC EXAMINATION: WBC, UA: >30 /hpf — AB (ref 0–5)

## 2020-11-09 LAB — BLADDER SCAN AMB NON-IMAGING: Scan Result: 120

## 2020-11-09 MED ORDER — SULFAMETHOXAZOLE-TRIMETHOPRIM 800-160 MG PO TABS: 1.0000 | ORAL_TABLET | Freq: Two times a day (BID) | ORAL | 0 refills | Status: DC

## 2020-11-10 LAB — CYTOLOGY - NON PAP

## 2020-11-12 LAB — CULTURE, URINE COMPREHENSIVE

## 2020-11-13 ENCOUNTER — Other Ambulatory Visit: Payer: Self-pay | Admitting: Radiology

## 2020-11-13 ENCOUNTER — Telehealth: Payer: Self-pay | Admitting: Family Medicine

## 2020-11-13 LAB — CULTURE, URINE COMPREHENSIVE

## 2020-11-13 NOTE — Telephone Encounter (Signed)
Patient notified and voiced understanding.

## 2020-11-13 NOTE — Telephone Encounter (Signed)
-----   Message from Nori Riis, PA-C sent at 11/13/2020  8:01 AM EST ----- Please let Nathan Holland know that his urine culture was positive for infection and that the Septra DS is the correct antibiotic.  He needs to finish all the tablets.

## 2020-11-21 NOTE — Progress Notes (Signed)
11/09/2020 10:20 AM   Rosalyn Gess Cheever 1956-05-24 [id number]  Referring provider: Wilford Corner, PA-C [address] [place],  [place] [address]  No chief complaint on file.  Urological history: 1. Prostate cancer - PSA <0.01 ng/mL in 03/2019 - treated with brachytherapy - unknown pathology  2. Bladder cancer - high-grade T1/CIS status post induction BCG -Last surveillance cystoscopy performed on November 08, 2020 noted an irregular prostatic fossa closest to the apex, nodular requiring a wire to bypass the area, adherent calcifications on the left lateral bladder wall which are most likely dystrophic calcifications at resection site without any obvious underlying tumor -Schedule for cystoscopy with urethral dilation, urethral biopsy, possible bladder biopsy and retrograde pyelograms on December 11, 2020  3. High risk hematuria - former smoker - CTU 02/2020 bilateral nephrolithiasis - largest stone 6 mm and 16 mm polypoid soft tissue lesion posterior bladder wall - cysto 02/2020 Bladder mucosa reveals large spherical tumor w/ calcification/blood product 2 cm on left lateral bladder wall. Capillary carpeting noted just beyond trigone.  - see #2 for further details   4. Urinary retention - secondary to clot and hematuria - PVR's moderate  HPI: ERNEST HYNEMAN is a 65 y.o. male who presents today for urinary urgency.    He underwent surveillance cystoscopy yesterday and he states he was up all night with urgency and frequency.  Patient denies any modifying or aggravating factors.  Patient denies any gross hematuria, dysuria or suprapubic/flank pain.  Patient denies any fevers, chills, nausea or vomiting.   UA nitrite positive, greater than 30 WBCs, 11-30 RBCs and many bacteria.  His PVR was 120 mL.  PMH: Past Medical History:  Diagnosis Date  . Bladder cancer (HCC) 2021   TURBT with Gemcitabine  . GERD (gastroesophageal reflux disease)   . History of  kidney stones 2011,2012   previous lithotripsies  . Prostate cancer [facility]) 2012   prostate with seed placement    Surgical History: Past Surgical History:  Procedure Laterality Date  . ETHMOIDECTOMY Bilateral 03/31/2018   Procedure: ETHMOIDECTOMY  WITH FRONTAL EXPLORATION;  Surgeon: Geanie Logan, MD;  Location: [facility];  Service: ENT;  Laterality: Bilateral;  . EXTRACORPOREAL SHOCK WAVE LITHOTRIPSY  2011,2012  . EXTRACORPOREAL SHOCK WAVE LITHOTRIPSY Left 01/02/2017   Procedure: EXTRACORPOREAL SHOCK WAVE LITHOTRIPSY (ESWL);  Surgeon: Orson Ape, MD;  Location: [facility];  Service: Urology;  Laterality: Left;  . IMAGE GUIDED SINUS SURGERY Bilateral 03/31/2018   Procedure: IMAGE GUIDED SINUS SURGERY;  Surgeon: Geanie Logan, MD;  Location: [facility];  Service: ENT;  Laterality: Bilateral;  NEED STRYKER DISK GAVE STRYKER DISK TO CECE 6-11  KP  . INSERTION PROSTATE RADIATION SEED  2012  . MAXILLARY ANTROSTOMY Bilateral 03/31/2018   Procedure: MAXILLARY ANTROSTOMY WITH TISSUE REMOVAL;  Surgeon: Geanie Logan, MD;  Location: [facility];  Service: ENT;  Laterality: Bilateral;  . TRANSURETHRAL RESECTION OF BLADDER TUMOR WITH MITOMYCIN-C N/A 02/24/2020   Procedure: TRANSURETHRAL RESECTION OF BLADDER TUMOR WITH Gemcitabine;  Surgeon: Vanna Scotland, MD;  Location: [facility];  Service: Urology;  Laterality: N/A;  . TRANSURETHRAL RESECTION OF BLADDER TUMOR WITH MITOMYCIN-C N/A 03/23/2020   Procedure: TRANSURETHRAL RESECTION OF BLADDER TUMOR WITH Gemcitabine;  Surgeon: Vanna Scotland, MD;  Location: [facility];  Service: Urology;  Laterality: N/A;    Home Medications:  Allergies as of 11/09/2020   No Known Allergies     Medication List       Accurate as of November 09, 2020 11:59 PM. If you have any questions, ask your nurse or doctor.        sulfamethoxazole-trimethoprim 800-160 MG tablet Commonly known as: BACTRIM DS Take 1 tablet by mouth every 12 (twelve)  hours.   tamsulosin 0.4 MG Caps capsule Commonly known as: FLOMAX Take 0.4 mg by mouth at bedtime.       Allergies: No Known Allergies  Family History: No family history on file.  Social History:  reports that he quit smoking about 15 years ago. He smoked 1.00 pack per day. He has never used smokeless tobacco. He reports that he does not drink alcohol and does not use drugs.  ROS: Pertinent ROS in HPI  Physical Exam: BP (!) 174/76   Pulse 85   Wt 256 lb (116.1 kg)   BMI 35.70 kg/m   Constitutional:  Well nourished. Alert and oriented, No acute distress. HEENT: [place] AT, mask in place.  Trachea midline Cardiovascular: No clubbing, cyanosis, or edema. Respiratory: Normal respiratory effort, no increased work of breathing. Neurologic: Grossly intact, no focal deficits, moving all 4 extremities. Psychiatric: Normal mood and affect.  Laboratory Data: Specimen:  Blood  Ref Range & Units 1 mo ago  WBC 5.1 - 10.8 thou/mcL 8.6   RBC 4.05 - 5.64 million/mcL 4.90   HGB 13.5 - 17.5 gm/dL 16.1   HCT 09.6 - 04.5 % 44.3   MCV 83 - 97 fL 90   MCH 28.0 - 33.0 pg 30.8   MCHC 32.0 - 36.0 gm/dL 40.9   Plt Ct 811 - 914 thou/mcL 163   RDW SD 36.0 - 47.0 fL 43.9   MPV 8.9 - 11.0 fL 10.9   NRBC% 0 /100WBC 0.0   NRBC 0 thou/mcL 0.000   NEUTROPHIL % 50.0 - 70.0 % 52.5   LYMPHOCYTE % 25.0 - 40.0 % 22.1Low   MONOCYTE % 4.0 - 12.0 % 6.3   Eosinophil % 1.0 - 6.0 % 17.5High   BASOPHIL % 0.0 - 2.0 % 1.3   IG% 0.001 - 0.429 % 0.300   ABSOLUTE NEUTROPHIL COUNT 1.50 - 7.50 thou/mcL 4.53   ABSOLUTE LYMPHOCYTE COUNT 1.0 - 4.5 thou/mcL 1.9   MONO ABSOLUTE 0.1 - 0.8 thou/mcL 0.5   EOS ABSOLUTE 0.0 - 0.5 thou/mcL 1.5High   BASO ABSOLUTE 0.0 - 0.2 thou/mcL 0.1   IG ABSOLUTE 0.001 - 0.031 thou/mcL 0.030   Resulting Agency  [facility]  Specimen Collected: 09/30/20 4:45 AM Last Resulted: 09/30/20 4:51 AM  Received From: [facility]  Result Received: 10/10/20 12:06 PM    Specimen:  Blood  Ref Range & Units 1 mo ago Comments  Na 136 - 146 mmol/L 140    Potassium 3.7 - 5.4 mmol/L 3.8    Cl 97 - 108 mmol/L 106    CO2 20 - 32 mmol/L 23    AGAP 7 - 16 mmol/L 11    Glucose 65 - 99 mg/dL [id number]    BUN 8 - 27 mg/dL 25    Creatinine 1.30 - 1.27 mg/dL 8.65HQI    Ca 8.6 - 69.6 mg/dL 9.1    ALK PHOS 25 - 295 U/L 72    T Bili 0.00 - 1.20 mg/dL 2.84    Total Protein 6.0 - 8.5 gm/dL 6.8    Alb 3.6 - 4.8 gm/dL 4.0    GLOBULIN 1.5 - 4.5 gm/dL 2.8    ALBUMIN/GLOBULIN RATIO 1.1 - 2.5 1.4    BUN/CREAT RATIO 11.0 - 26.0 33.3High  ALT 0 - 55 U/L 13    AST 0 - 40 U/L 14    GFR AFRICAN AMERICAN mL/min/1.79m2 111     Urinalysis Component     Latest Ref Rng & Units 11/09/2020  Specific Gravity, UA     1.005 - 1.030 1.020  pH, UA     5.0 - 7.5 7.0  Color, UA     Yellow Straw  Appearance Ur     Clear Cloudy (A)  Leukocytes,UA     Negative 2+ (A)  Protein,UA     Negative/Trace Negative  Glucose, UA     Negative Negative  Ketones, UA     Negative Negative  RBC, UA     Negative 1+ (A)  Bilirubin, UA     Negative Negative  Urobilinogen, Ur     0.2 - 1.0 mg/dL 0.2  Nitrite, UA     Negative Positive (A)  Microscopic Examination      See below:   Component     Latest Ref Rng & Units 11/09/2020  WBC, UA     0 - 5 /hpf >30 (A)  RBC     0 - 2 /hpf 11-30 (A)  Epithelial Cells (non renal)     0 - 10 /hpf 0-10  Bacteria, UA     None seen/Few Many (A)  I have reviewed the labs.   Pertinent Imaging: Results for COPEN, SHEELEY "MIKE" (MRN [id number]) as of 11/22/2020 10:00  Ref. Range 11/09/2020 09:35  Scan Result Unknown 120 ml   Assessment & Plan:    1. Urinary urgency - Urinalysis, Complete- grossly infected - BLADDER SCAN AMB NON-IMAGING - CULTURE, URINE COMPREHENSIVE - Start Septra DS, twice daily for 7 days empirically - We will adjust antibiotic if necessary once culture results are available  2. Incomplete bladder emptying -  continue tamsulosin 0.4 mg daily  3. Bladder cancer - cysto and biopsies scheduled for March 7th  4. Prostate cancer - will draw PSA at pre-op visit    Return for keep pre-op appointment on the 24th .  These notes generated with voice recognition software. I apologize for typographical errors.  Michiel Cowboy, PA-C  [facility] [address]  [address] [place], [place] [address] [phone]

## 2020-11-22 ENCOUNTER — Encounter: Payer: Self-pay | Admitting: Urology

## 2020-11-28 ENCOUNTER — Other Ambulatory Visit: Payer: Self-pay | Admitting: *Deleted

## 2020-11-28 DIAGNOSIS — R310 Gross hematuria: Secondary | ICD-10-CM

## 2020-11-28 DIAGNOSIS — C61 Malignant neoplasm of prostate: Secondary | ICD-10-CM

## 2020-11-29 ENCOUNTER — Other Ambulatory Visit: Payer: Self-pay | Admitting: Radiology

## 2020-11-29 DIAGNOSIS — J069 Acute upper respiratory infection, unspecified: Secondary | ICD-10-CM | POA: Diagnosis not present

## 2020-11-29 DIAGNOSIS — U071 COVID-19: Secondary | ICD-10-CM | POA: Diagnosis not present

## 2020-11-30 ENCOUNTER — Inpatient Hospital Stay: Admission: RE | Admit: 2020-11-30 | Payer: Medicare HMO | Source: Ambulatory Visit

## 2020-12-01 ENCOUNTER — Other Ambulatory Visit: Payer: Medicare HMO

## 2020-12-06 ENCOUNTER — Other Ambulatory Visit: Payer: Medicare HMO

## 2020-12-07 ENCOUNTER — Other Ambulatory Visit: Payer: Medicare HMO

## 2020-12-08 ENCOUNTER — Other Ambulatory Visit: Payer: Medicare HMO

## 2020-12-08 DIAGNOSIS — M25561 Pain in right knee: Secondary | ICD-10-CM | POA: Diagnosis not present

## 2020-12-08 DIAGNOSIS — R030 Elevated blood-pressure reading, without diagnosis of hypertension: Secondary | ICD-10-CM | POA: Diagnosis not present

## 2020-12-15 ENCOUNTER — Other Ambulatory Visit: Payer: Self-pay

## 2020-12-15 ENCOUNTER — Other Ambulatory Visit: Payer: Medicare HMO

## 2020-12-15 DIAGNOSIS — R310 Gross hematuria: Secondary | ICD-10-CM | POA: Diagnosis not present

## 2020-12-15 DIAGNOSIS — C61 Malignant neoplasm of prostate: Secondary | ICD-10-CM

## 2020-12-15 LAB — URINALYSIS, COMPLETE
Bilirubin, UA: NEGATIVE
Glucose, UA: NEGATIVE
Leukocytes,UA: NEGATIVE
Nitrite, UA: NEGATIVE
Protein,UA: NEGATIVE
RBC, UA: NEGATIVE
Specific Gravity, UA: 1.03 (ref 1.005–1.030)
Urobilinogen, Ur: 0.2 mg/dL (ref 0.2–1.0)
pH, UA: 5 (ref 5.0–7.5)

## 2020-12-15 LAB — MICROSCOPIC EXAMINATION: Bacteria, UA: NONE SEEN

## 2020-12-16 LAB — PSA: Prostate Specific Ag, Serum: <0.1 ng/mL (ref 0.0–4.0)

## 2020-12-18 DIAGNOSIS — Q741 Congenital malformation of knee: Secondary | ICD-10-CM | POA: Diagnosis not present

## 2020-12-18 DIAGNOSIS — M25561 Pain in right knee: Secondary | ICD-10-CM | POA: Diagnosis not present

## 2020-12-18 DIAGNOSIS — M25461 Effusion, right knee: Secondary | ICD-10-CM | POA: Diagnosis not present

## 2020-12-19 ENCOUNTER — Other Ambulatory Visit
Admission: RE | Admit: 2020-12-19 | Discharge: 2020-12-19 | Disposition: A | Discharge status: Home / Self Care | Payer: Medicare HMO | Source: Ambulatory Visit | Attending: Urology | Admitting: Urology

## 2020-12-19 ENCOUNTER — Other Ambulatory Visit: Payer: Self-pay

## 2020-12-19 NOTE — Patient Instructions (Signed)
Your procedure is scheduled on: Monday 12/25/20.  Report to THE FIRST FLOOR REGISTRATION DESK IN THE MEDICAL MALL ON THE MORNING OF SURGERY FIRST, THEN YOU WILL CHECK IN AT THE SURGERY INFORMATION DESK LOCATED OUTSIDE THE SAME DAY SURGERY DEPARTMENT LOCATED ON 2ND FLOOR MEDICAL MALL ENTRANCE. To find out your arrival time please call [phone] between 1PM - 3PM on Friday 12/22/20.   Remember: Instructions that are not followed completely may result in serious medical risk, up to and including death, or upon the discretion of your surgeon and anesthesiologist your surgery may need to be rescheduled.     __X__ 1. Do not eat food or drink any beverages after midnight the night before your procedure.                  __X__2.  On the morning of surgery brush your teeth with toothpaste and water, you may rinse your mouth with mouthwash if you wish.  Do not swallow any toothpaste or mouthwash.    __X__ 3.  No Alcohol for 24 hours before or after surgery.  __X__ 4.  Do Not Smoke or use e-cigarettes For 24 Hours Prior to Your Surgery.                 Do not use any chewable tobacco products for at least 6 hours prior to                 surgery.  __X__5.  Notify your doctor if there is any change in your medical condition      (cold, fever, infections).      Do NOT wear jewelry, make-up, hairpins, clips or nail polish. Do NOT wear lotions, powders, or perfumes.  Do NOT shave 48 hours prior to surgery. Men may shave face and neck. Do NOT bring valuables to the hospital.     [facility] is not responsible for any belongings or valuables.   Contacts, dentures/partials or body piercings may not be worn into surgery. Bring a case for your contacts, glasses or hearing aids, a denture cup will be supplied.    Patients discharged the day of surgery will not be allowed to drive home.     __X__ Take these medicines the morning of surgery with A SIP OF WATER:     1. NONE     __X__ Shower  the morning of surgery before arrival.  __X__ Stop Anti-inflammatories 7 days before surgery such as Advil, Ibuprofen, Motrin, BC or Goodies Powder, Naprosyn, Naproxen, Aleve, Aspirin, Meloxicam. May take Tylenol if needed for pain or discomfort.   __X__Do not start taking any new herbal supplements or vitamins prior to your procedure.  __X__ Stop the following herbal supplements or vitamins:  Ascorbic Acid (VITAMIN C)  zinc gluconate    Wear comfortable clothing (specific to your surgery type) to the hospital.  Plan for stool softeners for home use; pain medications have a tendency to cause constipation. You can also help prevent constipation by eating foods high in fiber such as fruits and vegetables and drinking plenty of fluids as your diet allows.  After surgery, you can prevent lung complications by doing breathing exercises.Take deep breaths and cough every 1-2 hours. Your doctor may order a device called an Incentive Spirometer to help you take deep breaths.  Please call the [place] Department at [phone] if you have any questions about these instructions.

## 2020-12-21 ENCOUNTER — Other Ambulatory Visit: Admission: RE | Admit: 2020-12-21 | Payer: Medicare HMO | Source: Ambulatory Visit

## 2020-12-22 ENCOUNTER — Telehealth: Payer: Self-pay

## 2020-12-22 MED ORDER — CEPHALEXIN 500 MG PO CAPS
500.0000 mg | ORAL_CAPSULE | Freq: Four times a day (QID) | ORAL | 0 refills | Status: AC
Start: 2020-12-22 — End: 2020-12-29

## 2020-12-22 NOTE — Telephone Encounter (Signed)
Spoke with patient. Pt verbalized understanding. Medication was sent to the correct pharmacy as per pt.

## 2020-12-22 NOTE — Telephone Encounter (Signed)
-----   Message from Hollice Espy, MD sent at 12/22/2020 10:28 AM EDT ----- Please Treat with Keflex 500 mg four times a day starting today for a total of seven days.

## 2020-12-23 LAB — CULTURE, URINE COMPREHENSIVE

## 2020-12-24 MED ORDER — CHLORHEXIDINE GLUCONATE 0.12 % MT SOLN: 15.0000 mL | Freq: Once | OROMUCOSAL | Status: AC

## 2020-12-24 MED ORDER — FAMOTIDINE 20 MG PO TABS
20.0000 mg | ORAL_TABLET | Freq: Once | ORAL | Status: AC
  Administered 2020-12-25: 20 mg via ORAL

## 2020-12-24 MED ORDER — CEFAZOLIN SODIUM-DEXTROSE 2-4 GM/100ML-% IV SOLN
2.0000 g | INTRAVENOUS | Status: AC
  Administered 2020-12-25: 2 g via INTRAVENOUS

## 2020-12-24 MED ORDER — ORAL CARE MOUTH RINSE: 15.0000 mL | Freq: Once | OROMUCOSAL | Status: AC

## 2020-12-24 MED ORDER — LACTATED RINGERS IV SOLN: INTRAVENOUS | Status: DC

## 2020-12-25 ENCOUNTER — Encounter
Admission: RE | Disposition: A | Discharge status: Home / Self Care | Payer: Self-pay | Source: Ambulatory Visit | Attending: Urology

## 2020-12-25 ENCOUNTER — Ambulatory Visit: Payer: Medicare HMO | Admitting: Urgent Care

## 2020-12-25 ENCOUNTER — Ambulatory Visit: Payer: Medicare HMO

## 2020-12-25 ENCOUNTER — Ambulatory Visit
Admission: RE | Admit: 2020-12-25 | Discharge: 2020-12-25 | Disposition: A | Discharge status: Home / Self Care | Payer: Medicare HMO | Source: Ambulatory Visit | Attending: Urology | Admitting: Urology

## 2020-12-25 ENCOUNTER — Encounter: Payer: Self-pay | Admitting: Urology

## 2020-12-25 DIAGNOSIS — Z923 Personal history of irradiation: Secondary | ICD-10-CM | POA: Insufficient documentation

## 2020-12-25 DIAGNOSIS — R3915 Urgency of urination: Secondary | ICD-10-CM | POA: Diagnosis not present

## 2020-12-25 DIAGNOSIS — Z8546 Personal history of malignant neoplasm of prostate: Secondary | ICD-10-CM | POA: Diagnosis not present

## 2020-12-25 DIAGNOSIS — Z8551 Personal history of malignant neoplasm of bladder: Secondary | ICD-10-CM | POA: Diagnosis not present

## 2020-12-25 DIAGNOSIS — K219 Gastro-esophageal reflux disease without esophagitis: Secondary | ICD-10-CM | POA: Diagnosis not present

## 2020-12-25 DIAGNOSIS — N329 Bladder disorder, unspecified: Secondary | ICD-10-CM | POA: Insufficient documentation

## 2020-12-25 DIAGNOSIS — N3289 Other specified disorders of bladder: Secondary | ICD-10-CM | POA: Diagnosis not present

## 2020-12-25 DIAGNOSIS — N35919 Unspecified urethral stricture, male, unspecified site: Secondary | ICD-10-CM | POA: Diagnosis not present

## 2020-12-25 DIAGNOSIS — R339 Retention of urine, unspecified: Secondary | ICD-10-CM | POA: Insufficient documentation

## 2020-12-25 DIAGNOSIS — Z87442 Personal history of urinary calculi: Secondary | ICD-10-CM | POA: Diagnosis not present

## 2020-12-25 DIAGNOSIS — Z87891 Personal history of nicotine dependence: Secondary | ICD-10-CM | POA: Diagnosis not present

## 2020-12-25 DIAGNOSIS — N4289 Other specified disorders of prostate: Secondary | ICD-10-CM | POA: Diagnosis not present

## 2020-12-25 DIAGNOSIS — N35811 Other urethral stricture, male, meatal: Secondary | ICD-10-CM | POA: Diagnosis not present

## 2020-12-25 HISTORY — PX: CYSTOSCOPY W/ RETROGRADES: SHX1426

## 2020-12-25 HISTORY — PX: CYSTOSCOPY WITH BIOPSY: SHX5122

## 2020-12-25 SURGERY — CYSTOSCOPY, WITH BIOPSY
Anesthesia: General

## 2020-12-25 MED ORDER — FENTANYL CITRATE (PF) 100 MCG/2ML IJ SOLN
INTRAMUSCULAR | Status: AC
  Filled 2020-12-25: qty 2

## 2020-12-25 MED ORDER — PHENYLEPHRINE HCL (PRESSORS) 10 MG/ML IV SOLN
INTRAVENOUS | Status: DC | PRN
  Administered 2020-12-25: 100 ug via INTRAVENOUS

## 2020-12-25 MED ORDER — ONDANSETRON HCL 4 MG/2ML IJ SOLN
INTRAMUSCULAR | Status: AC
  Filled 2020-12-25: qty 2

## 2020-12-25 MED ORDER — FENTANYL CITRATE (PF) 100 MCG/2ML IJ SOLN: 25.0000 ug | INTRAMUSCULAR | Status: DC | PRN

## 2020-12-25 MED ORDER — LIDOCAINE HCL (PF) 2 % IJ SOLN
INTRAMUSCULAR | Status: AC
  Filled 2020-12-25: qty 5

## 2020-12-25 MED ORDER — PROPOFOL 10 MG/ML IV BOLUS
INTRAVENOUS | Status: DC | PRN
  Administered 2020-12-25: 180 mg via INTRAVENOUS
  Administered 2020-12-25: 20 mg via INTRAVENOUS

## 2020-12-25 MED ORDER — DEXAMETHASONE SODIUM PHOSPHATE 10 MG/ML IJ SOLN
INTRAMUSCULAR | Status: AC
  Filled 2020-12-25: qty 1

## 2020-12-25 MED ORDER — OXYCODONE HCL 5 MG PO TABS: 5.0000 mg | ORAL_TABLET | Freq: Once | ORAL | Status: DC | PRN

## 2020-12-25 MED ORDER — FAMOTIDINE 20 MG PO TABS
ORAL_TABLET | ORAL | Status: AC
  Filled 2020-12-25: qty 1

## 2020-12-25 MED ORDER — ACETAMINOPHEN 10 MG/ML IV SOLN
INTRAVENOUS | Status: AC
  Filled 2020-12-25: qty 100

## 2020-12-25 MED ORDER — CHLORHEXIDINE GLUCONATE 0.12 % MT SOLN
OROMUCOSAL | Status: AC
  Administered 2020-12-25: 15 mL via OROMUCOSAL
  Filled 2020-12-25: qty 15

## 2020-12-25 MED ORDER — PROMETHAZINE HCL 25 MG/ML IJ SOLN: 6.2500 mg | INTRAMUSCULAR | Status: DC | PRN

## 2020-12-25 MED ORDER — FENTANYL CITRATE (PF) 100 MCG/2ML IJ SOLN
INTRAMUSCULAR | Status: DC | PRN
  Administered 2020-12-25 (×4): 25 ug via INTRAVENOUS

## 2020-12-25 MED ORDER — PROPOFOL 10 MG/ML IV BOLUS
INTRAVENOUS | Status: AC
  Filled 2020-12-25: qty 20

## 2020-12-25 MED ORDER — DEXMEDETOMIDINE HCL 200 MCG/2ML IV SOLN
INTRAVENOUS | Status: DC | PRN
  Administered 2020-12-25: 4 ug via INTRAVENOUS
  Administered 2020-12-25: 12 ug via INTRAVENOUS
  Administered 2020-12-25: 4 ug via INTRAVENOUS

## 2020-12-25 MED ORDER — DEXMEDETOMIDINE (PRECEDEX) IN NS 20 MCG/5ML (4 MCG/ML) IV SYRINGE
PREFILLED_SYRINGE | INTRAVENOUS | Status: AC
  Filled 2020-12-25: qty 5

## 2020-12-25 MED ORDER — ACETAMINOPHEN 10 MG/ML IV SOLN
INTRAVENOUS | Status: DC | PRN
  Administered 2020-12-25: 1000 mg via INTRAVENOUS

## 2020-12-25 MED ORDER — ONDANSETRON HCL 4 MG/2ML IJ SOLN
INTRAMUSCULAR | Status: DC | PRN
  Administered 2020-12-25: 4 mg via INTRAVENOUS

## 2020-12-25 MED ORDER — CEFAZOLIN SODIUM-DEXTROSE 2-4 GM/100ML-% IV SOLN
INTRAVENOUS | Status: AC
  Filled 2020-12-25: qty 100

## 2020-12-25 MED ORDER — DEXAMETHASONE SODIUM PHOSPHATE 10 MG/ML IJ SOLN
INTRAMUSCULAR | Status: DC | PRN
  Administered 2020-12-25: 8 mg via INTRAVENOUS

## 2020-12-25 MED ORDER — LIDOCAINE HCL (CARDIAC) PF 100 MG/5ML IV SOSY
PREFILLED_SYRINGE | INTRAVENOUS | Status: DC | PRN
  Administered 2020-12-25: 100 mg via INTRAVENOUS

## 2020-12-25 MED ORDER — MIDAZOLAM HCL 2 MG/2ML IJ SOLN
INTRAMUSCULAR | Status: DC | PRN
  Administered 2020-12-25 (×2): 1 mg via INTRAVENOUS

## 2020-12-25 MED ORDER — MIDAZOLAM HCL 2 MG/2ML IJ SOLN
INTRAMUSCULAR | Status: AC
  Filled 2020-12-25: qty 2

## 2020-12-25 MED ORDER — IOHEXOL 180 MG/ML  SOLN
INTRAMUSCULAR | Status: DC | PRN
  Administered 2020-12-25: 20 mL

## 2020-12-25 MED ORDER — OXYCODONE HCL 5 MG/5ML PO SOLN: 5.0000 mg | Freq: Once | ORAL | Status: DC | PRN

## 2020-12-25 MED ORDER — MEPERIDINE HCL 50 MG/ML IJ SOLN
6.2500 mg | INTRAMUSCULAR | Status: DC | PRN
Start: 2020-12-25 — End: 2020-12-25

## 2020-12-25 SURGICAL SUPPLY — 33 items
BAG DRAIN CYSTO-URO LG1000N (MISCELLANEOUS) ×3 IMPLANT
BAG DRN RND TRDRP ANRFLXCHMBR (UROLOGICAL SUPPLIES) ×2
BAG URINE DRAIN 2000ML AR STRL (UROLOGICAL SUPPLIES) ×3 IMPLANT
BRUSH SCRUB EZ  4% CHG (MISCELLANEOUS) ×1
BRUSH SCRUB EZ 1% IODOPHOR (MISCELLANEOUS) ×3 IMPLANT
BRUSH SCRUB EZ 4% CHG (MISCELLANEOUS) ×2 IMPLANT
CATH COUDE FOLEY 2W 5CC 16FR (CATHETERS) ×3 IMPLANT
CATH FOL 2WAY LX 16X5 (CATHETERS) IMPLANT
CATH FOL 2WAY LX 18X30 (CATHETERS) IMPLANT
CATH URETHRAL DIL 7.0X29 (CATHETERS) ×3 IMPLANT
CATH URETL 5X70 OPEN END (CATHETERS) ×3 IMPLANT
DRAPE UTILITY 15X26 TOWEL STRL (DRAPES) ×3 IMPLANT
DRSG TELFA 4X3 1S NADH ST (GAUZE/BANDAGES/DRESSINGS) ×3 IMPLANT
ELECT REM PT RETURN 9FT ADLT (ELECTROSURGICAL) ×3
ELECTRODE REM PT RTRN 9FT ADLT (ELECTROSURGICAL) ×2 IMPLANT
GLOVE SURG ENC MOIS LTX SZ6.5 (GLOVE) ×3 IMPLANT
GOWN STRL REUS W/ TWL LRG LVL3 (GOWN DISPOSABLE) ×4 IMPLANT
GOWN STRL REUS W/TWL LRG LVL3 (GOWN DISPOSABLE) ×6
GUIDEWIRE STR DUAL SENSOR (WIRE) ×3 IMPLANT
KIT TURNOVER CYSTO (KITS) ×3 IMPLANT
MANIFOLD NEPTUNE II (INSTRUMENTS) ×3 IMPLANT
NDL SAFETY ECLIPSE 18X1.5 (NEEDLE) ×2 IMPLANT
NEEDLE HYPO 18GX1.5 SHARP (NEEDLE) ×3
PACK CYSTO AR (MISCELLANEOUS) ×3 IMPLANT
SET CYSTO W/LG BORE CLAMP LF (SET/KITS/TRAYS/PACK) ×3 IMPLANT
SOL .9 NS 3000ML IRR  AL (IV SOLUTION) ×1
SOL .9 NS 3000ML IRR AL (IV SOLUTION) ×2
SOL .9 NS 3000ML IRR UROMATIC (IV SOLUTION) ×2 IMPLANT
SURGILUBE 2OZ TUBE FLIPTOP (MISCELLANEOUS) ×3 IMPLANT
SYR 10ML LL (SYRINGE) ×3 IMPLANT
SYR 30ML LL (SYRINGE) ×3 IMPLANT
WATER STERILE IRR 1000ML POUR (IV SOLUTION) ×3 IMPLANT
WATER STERILE IRR 3000ML UROMA (IV SOLUTION) ×3 IMPLANT

## 2020-12-25 NOTE — Progress Notes (Signed)
Bladder scan post void was 68mL. Patient voided another 25mL after scan

## 2020-12-25 NOTE — Discharge Instructions (Signed)
Transurethral Resection of Bladder Tumor (TURBT) or Bladder Biopsy ° ° °Definition: ° Transurethral Resection of the Bladder Tumor is a surgical procedure used to diagnose and remove tumors within the bladder. TURBT is the most common treatment for early stage bladder cancer. ° °General instructions: °   ° Your recent bladder surgery requires very little post hospital care but some definite precautions. ° °Despite the fact that no skin incisions were used, the area around the bladder incisions are raw and covered with scabs to promote healing and prevent bleeding. Certain precautions are needed to insure that the scabs are not disturbed over the next 2-4 weeks while the healing proceeds. ° °Because the raw surface inside your bladder and the irritating effects of urine you may expect frequency of urination and/or urgency (a stronger desire to urinate) and perhaps even getting up at night more often. This will usually resolve or improve slowly over the healing period. You may see some blood in your urine over the first 6 weeks. Do not be alarmed, even if the urine was clear for a while. Get off your feet and drink lots of fluids until clearing occurs. If you start to pass clots or don't improve call us. ° °Diet: ° °You may return to your normal diet immediately. Because of the raw surface of your bladder, alcohol, spicy foods, foods high in acid and drinks with caffeine may cause irritation or frequency and should be used in moderation. To keep your urine flowing freely and avoid constipation, drink plenty of fluids during the day (8-10 glasses). Tip: Avoid cranberry juice because it is very acidic. ° °Activity: ° °Your physical activity doesn't need to be restricted. However, if you are very active, you may see some blood in the urine. We suggest that you reduce your activity under the circumstances until the bleeding has stopped. ° °Bowels: ° °It is important to keep your bowels regular during the postoperative  period. Straining with bowel movements can cause bleeding. A bowel movement every other day is reasonable. Use a mild laxative if needed, such as milk of magnesia 2-3 tablespoons, or 2 Dulcolax tablets. Call if you continue to have problems. If you had been taking narcotics for pain, before, during or after your surgery, you may be constipated. Take a laxative if necessary. ° ° ° °Medication: ° °You should resume your pre-surgery medications unless told not to. In addition you may be given an antibiotic to prevent or treat infection. Antibiotics are not always necessary. All medication should be taken as prescribed until the bottles are finished unless you are having an unusual reaction to one of the drugs. ° ° °[facility] °[place], [place] [address] °[phone] ° ° ° ° °

## 2020-12-25 NOTE — H&P (Signed)
12/25/20 RRR CTAB  Nathan Holland Few 08/18/1956 [id number]  Referring provider: Donnamarie Rossetti, PA-C [address] [place],  [place] [address]   Urological history: 1. Prostate cancer - PSA <0.01 ng/mL in 03/2019 - treated with brachytherapy - unknown pathology  2. Bladder cancer - high-grade T1/CIS status post induction BCG -Last surveillance cystoscopy performed on November 08, 2020 noted an irregular prostatic fossa closest to the apex, nodular requiring a wire to bypass the area, adherent calcifications on the left lateral bladder wall which are most likely dystrophic calcifications at resection site without any obvious underlying tumor -Schedule for cystoscopy with urethral dilation, urethral biopsy, possible bladder biopsy and retrograde pyelograms on December 11, 2020  3. High risk hematuria - former smoker - CTU 02/2020 bilateral nephrolithiasis - largest stone 6 mm and 16 mm polypoid soft tissue lesion posterior bladder wall - cysto 02/2020 Bladder mucosareveals large spherical tumor w/ calcification/blood product 2 cm on left lateral bladder wall. Capillary carpeting noted just beyond trigone. - see #2 for further details   4. Urinary retention - secondary to clot and hematuria - PVR's moderate  HPI: Nathan Holland is a 65 y.o. male who presents today for urinary urgency.    He underwent surveillance cystoscopy yesterday and he states he was up all night with urgency and frequency.  Patient denies any modifying or aggravating factors.  Patient denies any gross hematuria, dysuria or suprapubic/flank pain.  Patient denies any fevers, chills, nausea or vomiting.   UA nitrite positive, greater than 30 WBCs, 11-30 RBCs and many bacteria.  His PVR was 120 mL.  PMH:     Past Medical History:  Diagnosis Date  . Bladder cancer ([place]) 2021   TURBT with Gemcitabine  . GERD (gastroesophageal reflux disease)   . History of kidney stones  2011,2012   previous lithotripsies  . Prostate cancer [facility]) 2012   prostate with seed placement    Surgical History:      Past Surgical History:  Procedure Laterality Date  . ETHMOIDECTOMY Bilateral 03/31/2018   Procedure: ETHMOIDECTOMY  WITH FRONTAL EXPLORATION;  Surgeon: Clyde Canterbury, MD;  Location: [place];  Service: ENT;  Laterality: Bilateral;  . EXTRACORPOREAL SHOCK WAVE LITHOTRIPSY  2011,2012  . EXTRACORPOREAL SHOCK WAVE LITHOTRIPSY Left 01/02/2017   Procedure: EXTRACORPOREAL SHOCK WAVE LITHOTRIPSY (ESWL);  Surgeon: Royston Cowper, MD;  Location: [facility];  Service: Urology;  Laterality: Left;  . IMAGE GUIDED SINUS SURGERY Bilateral 03/31/2018   Procedure: IMAGE GUIDED SINUS SURGERY;  Surgeon: Clyde Canterbury, MD;  Location: [place];  Service: ENT;  Laterality: Bilateral;  NEED STRYKER DISK GAVE STRYKER DISK TO CECE 6-11  KP  . INSERTION PROSTATE RADIATION SEED  2012  . MAXILLARY ANTROSTOMY Bilateral 03/31/2018   Procedure: MAXILLARY ANTROSTOMY WITH TISSUE REMOVAL;  Surgeon: Clyde Canterbury, MD;  Location: [place];  Service: ENT;  Laterality: Bilateral;  . TRANSURETHRAL RESECTION OF BLADDER TUMOR WITH MITOMYCIN-C N/A 02/24/2020   Procedure: TRANSURETHRAL RESECTION OF BLADDER TUMOR WITH Gemcitabine;  Surgeon: Hollice Espy, MD;  Location: [facility];  Service: Urology;  Laterality: N/A;  . TRANSURETHRAL RESECTION OF BLADDER TUMOR WITH MITOMYCIN-C N/A 03/23/2020   Procedure: TRANSURETHRAL RESECTION OF BLADDER TUMOR WITH Gemcitabine;  Surgeon: Hollice Espy, MD;  Location: [facility];  Service: Urology;  Laterality: N/A;    Home Medications:  Allergies as of 11/09/2020   No Known Allergies        Medication List  Accurate as of November 09, 2020 11:59 PM. If you have any questions, ask your nurse or doctor.        sulfamethoxazole-trimethoprim 800-160 MG tablet Commonly known as: BACTRIM DS Take 1 tablet by mouth every 12  (twelve) hours.   tamsulosin 0.4 MG Caps capsule Commonly known as: FLOMAX Take 0.4 mg by mouth at bedtime.       Allergies: No Known Allergies  Family History: No family history on file.  Social History:  reports that he quit smoking about 15 years ago. He smoked 1.00 pack per day. He has never used smokeless tobacco. He reports that he does not drink alcohol and does not use drugs.  ROS: Pertinent ROS in HPI  Physical Exam: BP (!) 174/76   Pulse 85   Wt 256 lb (116.1 kg)   BMI 35.70 kg/m   Constitutional: Well nourished. Alert and oriented, No acute distress. HEENT: [place] AT, mask in place. Trachea midline Cardiovascular: No clubbing, cyanosis, or edema. Respiratory: Normal respiratory effort, no increased work of breathing. Neurologic: Grossly intact, no focal deficits, moving all 4 extremities. Psychiatric: Normal mood and affect.  Laboratory Data: Specimen:  Blood  Ref Range & Units 1 mo ago  WBC 5.1 - 10.8 thou/mcL 8.6   RBC 4.05 - 5.64 million/mcL 4.90   HGB 13.5 - 17.5 gm/dL 15.1   HCT 40.5 - 52.5 % 44.3   MCV 83 - 97 fL 90   MCH 28.0 - 33.0 pg 30.8   MCHC 32.0 - 36.0 gm/dL 34.1   Plt Ct 150 - 400 thou/mcL 163   RDW SD 36.0 - 47.0 fL 43.9   MPV 8.9 - 11.0 fL 10.9   NRBC% 0 /100WBC 0.0   NRBC 0 thou/mcL 0.000   NEUTROPHIL % 50.0 - 70.0 % 52.5   LYMPHOCYTE % 25.0 - 40.0 % 22.1Low   MONOCYTE % 4.0 - 12.0 % 6.3   Eosinophil % 1.0 - 6.0 % 17.5High   BASOPHIL % 0.0 - 2.0 % 1.3   IG% 0.001 - 0.429 % 0.300   ABSOLUTE NEUTROPHIL COUNT 1.50 - 7.50 thou/mcL 4.53   ABSOLUTE LYMPHOCYTE COUNT 1.0 - 4.5 thou/mcL 1.9   MONO ABSOLUTE 0.1 - 0.8 thou/mcL 0.5   EOS ABSOLUTE 0.0 - 0.5 thou/mcL 1.5High   BASO ABSOLUTE 0.0 - 0.2 thou/mcL 0.1   IG ABSOLUTE 0.001 - 0.031 thou/mcL 0.030   Resulting Agency  [place]  Specimen Collected: 09/30/20 4:45 AM Last Resulted: 09/30/20 4:51 AM  Received From: [place]  Result Received:  10/10/20 12:06 PM   Specimen:  Blood  Ref Range & Units 1 mo ago Comments  Na 136 - 146 mmol/L 140    Potassium 3.7 - 5.4 mmol/L 3.8    Cl 97 - 108 mmol/L 106    CO2 20 - 32 mmol/L 23    AGAP 7 - 16 mmol/L 11    Glucose 65 - 99 mg/dL 135High    BUN 8 - 27 mg/dL 25    Creatinine 0.76 - 1.27 mg/dL 0.75Low    Ca 8.6 - 10.2 mg/dL 9.1    ALK PHOS 25 - 160 U/L 72    T Bili 0.00 - 1.20 mg/dL 0.40    Total Protein 6.0 - 8.5 gm/dL 6.8    Alb 3.6 - 4.8 gm/dL 4.0    GLOBULIN 1.5 - 4.5 gm/dL 2.8    ALBUMIN/GLOBULIN RATIO 1.1 - 2.5 1.4    BUN/CREAT RATIO 11.0 - 26.0  33.3High    ALT 0 - 55 U/L 13    AST 0 - 40 U/L 14    GFR AFRICAN AMERICAN mL/min/1.34m 111     Urinalysis Component     Latest Ref Rng & Units 11/09/2020  Specific Gravity, UA     1.005 - 1.030 1.020  pH, UA     5.0 - 7.5 7.0  Color, UA     Yellow Straw  Appearance Ur     Clear Cloudy (A)  Leukocytes,UA     Negative 2+ (A)  Protein,UA     Negative/Trace Negative  Glucose, UA     Negative Negative  Ketones, UA     Negative Negative  RBC, UA     Negative 1+ (A)  Bilirubin, UA     Negative Negative  Urobilinogen, Ur     0.2 - 1.0 mg/dL 0.2  Nitrite, UA     Negative Positive (A)  Microscopic Examination      See below:   Component     Latest Ref Rng & Units 11/09/2020  WBC, UA     0 - 5 /hpf >30 (A)  RBC     0 - 2 /hpf 11-30 (A)  Epithelial Cells (non renal)     0 - 10 /hpf 0-10  Bacteria, UA     None seen/Few Many (A)  I have reviewed the labs.   Pertinent Imaging: Results for GTRYGG, MANTZ"MIKE" (MRN [id number] as of 11/22/2020 10:00  Ref. Range 11/09/2020 09:35  Scan Result Unknown 120 ml   Assessment & Plan:    1. Urinary urgency - Urinalysis, Complete- grossly infected - BLADDER SCAN AMB NON-IMAGING - CULTURE, URINE COMPREHENSIVE - Start Septra DS, twice daily for 7 days empirically - We will adjust antibiotic if necessary once culture results are  available  2. Incomplete bladder emptying - continue tamsulosin 0.4 mg daily  3. Bladder cancer - cysto and biopsies scheduled for March 7th  4. Prostate cancer - will draw PSA at pre-op visit

## 2020-12-25 NOTE — Anesthesia Procedure Notes (Signed)
Procedure Name: LMA Insertion Date/Time: 12/25/2020 9:17 AM Performed by: Hedda Slade, CRNA Pre-anesthesia Checklist: Patient identified, Patient being monitored, Timeout performed, Emergency Drugs available and Suction available Patient Re-evaluated:Patient Re-evaluated prior to induction Oxygen Delivery Method: Circle system utilized Preoxygenation: Pre-oxygenation with 100% oxygen Induction Type: IV induction Ventilation: Mask ventilation without difficulty LMA: LMA inserted LMA Size: 4.5 Tube type: Oral Number of attempts: 1 Placement Confirmation: positive ETCO2 and breath sounds checked- equal and bilateral Tube secured with: Tape Dental Injury: Teeth and Oropharynx as per pre-operative assessment

## 2020-12-25 NOTE — Transfer of Care (Signed)
Immediate Anesthesia Transfer of Care Note  Patient: Rolen Conger Siegmann  Procedure(s) Performed: CYSTOSCOPY WITH URETHRAL BIOPSY AND POSSIBLE BLADDER BIOPSY (N/A ) CYSTOSCOPY WITH RETROGRADE PYELOGRAM (Bilateral )  Patient Location: PACU  Anesthesia Type:General  Level of Consciousness: sedated  Airway & Oxygen Therapy: Patient Spontanous Breathing and Patient connected to face mask oxygen  Post-op Assessment: Report given to RN and Post -op Vital signs reviewed and stable  Post vital signs: Reviewed and stable  Last Vitals:  Vitals Value Taken Time  BP 101/63 12/25/20 0953  Temp 36.6 C 12/25/20 0953  Pulse 69 12/25/20 0953  Resp 10 12/25/20 0953  SpO2 99 % 12/25/20 0953  Vitals shown include unvalidated device data.  Last Pain:  Vitals:   12/25/20 0953  TempSrc:   PainSc: 0-No pain         Complications: No complications documented.

## 2020-12-25 NOTE — Op Note (Signed)
Date of procedure: 12/25/20  Preoperative diagnosis:  1. History of bladder cancer 2. Bladder lesion/erythema 3. Membranous/ prostatic urethral stricture  Postoperative diagnosis:  1. Same as above  Procedure: 1. Cystoscopy 2. Bladder biopsy 3. Bilateral retrograde pyelogram  Surgeon: Hollice Espy, MD  Anesthesia: General  Complications: None  Intraoperative findings: Very tight prostate extending from the membranous urethra up to the entirety of the prostate with changes consistent with radiation, fixed pelvis.  Trabeculated bladder area with some mild erythema the posterior bladder wall question scope trauma.  Small changes in left lateral bladder wall with overlying small calcification.  Unremarkable bilateral retrograde pyelogram.  EBL: Minimal  Specimens: Left lateral bladder wall biopsy, random bladder biopsy  Drains: None  Indication: Nathan Holland is a 65 y.o. patient with personal history of T1 bladder cancer/CIS as well as prostate cancer status post brachytherapy who presents today for possible urethral dilation, cystoscopy with biopsy and bilateral retrograde pyelograms.  After reviewing the management options for treatment, he elected to proceed with the above surgical procedure(s). We have discussed the potential benefits and risks of the procedure, side effects of the proposed treatment, the likelihood of the patient achieving the goals of the procedure, and any potential problems that might occur during the procedure or recuperation. Informed consent has been obtained.  Description of procedure:  The patient was taken to the operating room and general anesthesia was induced.  The patient was placed in the dorsal lithotomy position, prepped and draped in the usual sterile fashion, and preoperative antibiotics were administered. A preoperative time-out was performed.   A 21 French cystoscope was advanced per urethra to the bladder.  Upon entering the  membranous urethra, is very tight and changes consistent with radiation which was whitish and scarlike.  He did up advancing a wire first to ensure that it be able to navigate the prostatic fossa without traumatizing or creating a falls pass.  Fluoroscopy confirmed that the wire was in the bladder.  And followed along the wire into the prostate.  This did not require any further dilation.  Notably, the scope was relatively tight and the pelvis was relatively fixed.  The bladder was trabeculated.  There are some erythema in the posterior bladder wall, question whether this may have been related to trauma or irritation from the wire.  There was also a small, approximately half centimeter area on the left lateral bladder wall with some overlying calcification.  There were no other lesions within the bladder.  Attention was then turned to the right ureteral orifice.  It was cannulated using a 5 [place] open-ended ureteral catheter just within the UO.  Gentle retrograde pyelogram was performed which showed no hydroureteronephrosis and unremarkable collecting system.  Attention was then turned to the left ureteral orifice.  The same exact procedure was performed which showed slight narrowing in the left distal ureter which was not suspicious in the renal unit drained quickly.  There are no other hydroureteronephrosis or filling defects.  Next, cold cup biopsy forceps were used to biopsy the left lateral bladder wall lesion.  This was passed off the field separately.  I then performed 3 additional biopsies focusing on the area of erythema the posterior and right lateral bladder wall.  These were sent off the field as random bladder biopsies.  I then used Bugbee electrocautery to fulgurate the areas.  Hemostasis was adequate.  I then slowly backed my scope into the prostatic urethra.  Again at this point, nothing was deemed  concerning enough for biopsy as clinically the changes were felt to be related to radiation.  I  fulgurated some areas of bleeding within the prostatic fossa.  I then remove the scope.  I passed a 5 [place] coud catheter to ensure that this went easily and I did.  The bladder was drained.  I elected to remove the catheter especially given how easy the catheter passed.  The patient was then cleaned and dried, repositioned in supine position, versa myesthesia, and taken to the PACU in stable condition.  Plan: I will call with his pathology results.  I suspect they will likely be benign.  We will plan for cystoscopy in the office again in 3 months.  All questions were answered.  I called and conveyed findings with his wife.  Hollice Espy, M.D.

## 2020-12-25 NOTE — Anesthesia Preprocedure Evaluation (Signed)
Anesthesia Evaluation  Patient identified by MRN, date of birth, ID band Patient awake    Reviewed: Allergy & Precautions, NPO status , Patient's Chart, lab work & pertinent test results  History of Anesthesia Complications Negative for: history of anesthetic complications  Airway Mallampati: II  TM Distance: >3 FB Neck ROM: Full    Dental  (+) Poor Dentition   Pulmonary neg sleep apnea, neg COPD, former smoker,    breath sounds clear to auscultation- rhonchi (-) wheezing      Cardiovascular Exercise Tolerance: Good (-) hypertension(-) CAD, (-) Past MI, (-) Cardiac Stents and (-) CABG  Rhythm:Regular Rate:Normal - Systolic murmurs and - Diastolic murmurs    Neuro/Psych neg Seizures negative neurological ROS  negative psych ROS   GI/Hepatic Neg liver ROS, GERD  ,  Endo/Other  negative endocrine ROSneg diabetes  Renal/GU negative Renal ROS     Musculoskeletal negative musculoskeletal ROS (+)   Abdominal (+) + obese,   Peds  Hematology negative hematology ROS (+)   Anesthesia Other Findings Past Medical History: 2021: Bladder cancer ([place])     Comment:  TURBT with Gemcitabine No date: GERD (gastroesophageal reflux disease) 8502,[id number]: History of kidney stones     Comment:  previous lithotripsies 2012: Prostate cancer ([place])     Comment:  prostate with seed placement   Reproductive/Obstetrics                             Anesthesia Physical Anesthesia Plan  ASA: II  Anesthesia Plan: General   Post-op Pain Management:    Induction: Intravenous  PONV Risk Score and Plan: 1 and Ondansetron  Airway Management Planned: LMA  Additional Equipment:   Intra-op Plan:   Post-operative Plan:   Informed Consent: I have reviewed the patients History and Physical, chart, labs and discussed the procedure including the risks, benefits and alternatives for the proposed anesthesia with the  patient or authorized representative who has indicated his/her understanding and acceptance.     Dental advisory given  Plan Discussed with: CRNA and Anesthesiologist  Anesthesia Plan Comments:         Anesthesia Quick Evaluation

## 2020-12-26 ENCOUNTER — Telehealth: Payer: Self-pay

## 2020-12-26 ENCOUNTER — Encounter: Payer: Self-pay | Admitting: Urology

## 2020-12-26 LAB — SURGICAL PATHOLOGY

## 2020-12-26 NOTE — Anesthesia Postprocedure Evaluation (Signed)
Anesthesia Post Note  Patient: Nathan Holland  Procedure(s) Performed: CYSTOSCOPY WITH URETHRAL BIOPSY AND POSSIBLE BLADDER BIOPSY (N/A ) CYSTOSCOPY WITH RETROGRADE PYELOGRAM (Bilateral )  Patient location during evaluation: PACU Anesthesia Type: General Level of consciousness: awake and alert and oriented Pain management: pain level controlled Vital Signs Assessment: post-procedure vital signs reviewed and stable Respiratory status: spontaneous breathing, nonlabored ventilation and respiratory function stable Cardiovascular status: blood pressure returned to baseline and stable Postop Assessment: no signs of nausea or vomiting Anesthetic complications: no   No complications documented.   Last Vitals:  Vitals:   12/25/20 1027 12/25/20 1122  BP: 130/73 (!) 146/75  Pulse: 61 65  Resp:  10  Temp: 36.4 C 36.6 C  SpO2: 98% 100%    Last Pain:  Vitals:   12/25/20 1122  TempSrc: Temporal  PainSc: 0-No pain                 Geneieve Duell

## 2020-12-26 NOTE — Telephone Encounter (Signed)
Per Dr Erlene Quan, scheduled patient for 3 month cysto with her. Patient notified, voiced understanding.

## 2020-12-27 ENCOUNTER — Telehealth: Payer: Self-pay | Admitting: *Deleted

## 2020-12-27 NOTE — Telephone Encounter (Addendum)
Patient informed, voiced understanding.    ----- Message from Hollice Espy, MD sent at 12/27/2020  9:11 AM EDT ----- Bladder biopsies were benign, no evidence of malignancy which is great news.  See you at your next cystoscopy in June.  Hollice Espy, MD

## 2021-01-24 DIAGNOSIS — L03116 Cellulitis of left lower limb: Secondary | ICD-10-CM | POA: Diagnosis not present

## 2021-01-24 DIAGNOSIS — S70362A Insect bite (nonvenomous), left thigh, initial encounter: Secondary | ICD-10-CM | POA: Diagnosis not present

## 2021-01-24 DIAGNOSIS — W57XXXA Bitten or stung by nonvenomous insect and other nonvenomous arthropods, initial encounter: Secondary | ICD-10-CM | POA: Diagnosis not present

## 2021-03-28 ENCOUNTER — Ambulatory Visit (INDEPENDENT_AMBULATORY_CARE_PROVIDER_SITE_OTHER): Payer: Medicare HMO | Admitting: Urology

## 2021-03-28 ENCOUNTER — Other Ambulatory Visit: Payer: Self-pay

## 2021-03-28 VITALS — BP 151/82 | HR 76 | Wt 250.0 lb

## 2021-03-28 DIAGNOSIS — C679 Malignant neoplasm of bladder, unspecified: Secondary | ICD-10-CM

## 2021-03-28 MED ORDER — OXYBUTYNIN CHLORIDE ER 10 MG PO TB24: 10.0000 mg | ORAL_TABLET | Freq: Every day | ORAL | 11 refills | Status: DC

## 2021-03-28 NOTE — Progress Notes (Signed)
   03/29/21  CC:  Chief Complaint  Patient presents with   Cysto     HPI:  65 year old male with a history of high-grade T1/CIS status post induction BCG returns today for office cystoscopy/surveillance.  More recently, he returned to the operating room in 12/2020 for urethral dilation, bladder biopsies, bilateral retrograde pyelogram.  These were consistent with benign etiologies.  His changes in the prostate which are consistent with previous radiation.   Blood pressure (!) 160/51, pulse 71, height 5\' 11"  (1.803 m), weight 256 lb (116.1 kg). NED. A&Ox3.   No respiratory distress   Abd soft, NT, ND Normal phallus with bilateral descended testicles  Cystoscopy Procedure Note  Patient identification was confirmed, informed consent was obtained, and patient was prepped using Betadine solution.  Lidocaine jelly was administered per urethral meatus.     Pre-Procedure: - Inspection reveals a normal caliber ureteral meatus.  Procedure: The flexible cystoscope was introduced without difficulty - No urethral strictures/lesions are present. -  Irregular prostatic fossa  closest to apex but hospital today under cystoscopic guidance although relatively tight - Normal bladder neck - Bilateral ureteral orifices identified - Bladder mucosa  reveals no ulcers, tumors, or lesions with multiple stellate scars, no papillary change or significant erythema - No bladder stones  Retroflexion unremarkable  Post-Procedure: - Patient tolerated the procedure well   Assessment/ Plan:  1. Malignant neoplasm of urinary bladder, unspecified site (HCC) No obvious evidence of malignancy within the bladder  Urine cytology again today  Given his high risk tumor, recommend maintenance BCG x3 and cystoscopy on a daily 73-month interval  - Urinalysis, Complete - Cytology - Non PAP;    Hollice Espy, MD

## 2021-03-29 LAB — URINALYSIS, COMPLETE
Bilirubin, UA: NEGATIVE
Glucose, UA: NEGATIVE
Ketones, UA: NEGATIVE
Leukocytes,UA: NEGATIVE
Nitrite, UA: NEGATIVE
RBC, UA: NEGATIVE
Specific Gravity, UA: >1.03 — ABNORMAL HIGH (ref 1.005–1.030)
Urobilinogen, Ur: 0.2 mg/dL (ref 0.2–1.0)
pH, UA: 5.5 (ref 5.0–7.5)

## 2021-03-29 LAB — MICROSCOPIC EXAMINATION
Bacteria, UA: NONE SEEN
Epithelial Cells (non renal): NONE SEEN /hpf (ref 0–10)
RBC, Urine: NONE SEEN /hpf (ref 0–2)

## 2021-03-30 LAB — CYTOLOGY - NON PAP

## 2021-04-04 ENCOUNTER — Telehealth: Payer: Self-pay

## 2021-04-04 NOTE — Telephone Encounter (Signed)
-----   Message from [place], [place] sent at 03/28/2021  2:45 PM EDT ----- Regarding: BCG Dr. Erlene Quan would like to do maintenance BCG on this patient, with a cysto 3 months after

## 2021-04-16 NOTE — Telephone Encounter (Signed)
Spoke with patient and explained split dose therapy. Patient will be on a Tri-dose split unless back order status changes. He has been scheduled for appts.

## 2021-04-17 ENCOUNTER — Ambulatory Visit (INDEPENDENT_AMBULATORY_CARE_PROVIDER_SITE_OTHER): Payer: Medicare HMO | Admitting: Physician Assistant

## 2021-04-17 ENCOUNTER — Other Ambulatory Visit: Payer: Self-pay

## 2021-04-17 DIAGNOSIS — C679 Malignant neoplasm of bladder, unspecified: Secondary | ICD-10-CM

## 2021-04-17 LAB — MICROSCOPIC EXAMINATION
Bacteria, UA: NONE SEEN
RBC, Urine: NONE SEEN /hpf (ref 0–2)

## 2021-04-17 LAB — URINALYSIS, COMPLETE
Bilirubin, UA: NEGATIVE
Glucose, UA: NEGATIVE
Ketones, UA: NEGATIVE
Leukocytes,UA: NEGATIVE
Nitrite, UA: NEGATIVE
Protein,UA: NEGATIVE
RBC, UA: NEGATIVE
Specific Gravity, UA: 1.025 (ref 1.005–1.030)
Urobilinogen, Ur: 0.2 mg/dL (ref 0.2–1.0)
pH, UA: 6 (ref 5.0–7.5)

## 2021-04-17 MED ORDER — BCG LIVE 50 MG IS SUSR
1.0800 mL | Freq: Once | INTRAVESICAL | Status: AC
  Administered 2021-04-17: 27 mg via INTRAVESICAL

## 2021-04-17 NOTE — Progress Notes (Signed)
BCG Bladder Instillation  BCG # 1 of 3  Due to Bladder Cancer patient is present today for a BCG treatment. Patient was cleaned and prepped in a sterile fashion with betadine. A 14FR coude catheter was inserted, urine return was noted 78ml, urine was yellow in color.  53ml of reconstituted BCG was instilled into the bladder. The catheter was then removed. Patient tolerated well, no complications were noted  Performed by: Debroah Loop, PA-C and Bradly Bienenstock, CMA  Additional notes: Patient received a split, 1/3 dose of BCG today due to BCG shortage. Patient notified and all questions answered.   Follow up: 1 week for BCG #2 of 3

## 2021-04-24 ENCOUNTER — Ambulatory Visit (INDEPENDENT_AMBULATORY_CARE_PROVIDER_SITE_OTHER): Payer: Medicare HMO | Admitting: Physician Assistant

## 2021-04-24 ENCOUNTER — Other Ambulatory Visit: Payer: Self-pay

## 2021-04-24 DIAGNOSIS — C679 Malignant neoplasm of bladder, unspecified: Secondary | ICD-10-CM

## 2021-04-24 LAB — URINALYSIS, COMPLETE
Bilirubin, UA: NEGATIVE
Glucose, UA: NEGATIVE
Ketones, UA: NEGATIVE
Leukocytes,UA: NEGATIVE
Nitrite, UA: NEGATIVE
Protein,UA: NEGATIVE
RBC, UA: NEGATIVE
Specific Gravity, UA: >1.03 — ABNORMAL HIGH (ref 1.005–1.030)
Urobilinogen, Ur: 0.2 mg/dL (ref 0.2–1.0)
pH, UA: 5.5 (ref 5.0–7.5)

## 2021-04-24 LAB — MICROSCOPIC EXAMINATION

## 2021-04-24 MED ORDER — BCG LIVE 50 MG IS SUSR
1.0800 mL | Freq: Once | INTRAVESICAL | Status: AC
  Administered 2021-04-24: 27 mg via INTRAVESICAL

## 2021-04-24 NOTE — Progress Notes (Signed)
BCG Bladder Instillation  BCG # 2 of 3  Due to Bladder Cancer patient is present today for a BCG treatment. Patient was cleaned and prepped in a sterile fashion with betadine. A 14FR coude catheter was inserted, urine return was noted 67ml, urine was yellow in color.  33ml of reconstituted BCG was instilled into the bladder. The catheter was then removed. Patient tolerated well, no complications were noted  Performed by: Debroah Loop, PA-C and Bradly Bienenstock, CMA  Additional notes: Patient received a split, 1/3 dose of BCG today due to BCG shortage. Patient notified and all questions answered.   Follow up: 1 week for BCG #3 of 3

## 2021-05-01 ENCOUNTER — Other Ambulatory Visit: Payer: Self-pay

## 2021-05-01 ENCOUNTER — Ambulatory Visit (INDEPENDENT_AMBULATORY_CARE_PROVIDER_SITE_OTHER): Payer: Medicare HMO | Admitting: Physician Assistant

## 2021-05-01 DIAGNOSIS — C679 Malignant neoplasm of bladder, unspecified: Secondary | ICD-10-CM | POA: Diagnosis not present

## 2021-05-01 MED ORDER — BCG LIVE 50 MG IS SUSR
1.0800 mL | Freq: Once | INTRAVESICAL | Status: AC
  Administered 2021-05-01: 27 mg via INTRAVESICAL

## 2021-05-01 NOTE — Progress Notes (Signed)
BCG Bladder Instillation  BCG # 3 of 3  Due to Bladder Cancer patient is present today for a BCG treatment. Patient was cleaned and prepped in a sterile fashion with betadine. A 14FR coude catheter was inserted, urine return was noted 82m, urine was yellow in color.  570mof reconstituted BCG was instilled into the bladder. The catheter was then removed. Patient tolerated well, no complications were noted  Performed by: SaDebroah LoopPA-C and CrBradly BienenstockCMA  Additional notes: Patient received a split, 1/3 dose of BCG today due to BCG shortage. Patient notified and all questions answered.   Follow up/ Additional notes: 3 months for cysto with Dr. BrErlene Quan

## 2021-05-03 LAB — URINALYSIS, COMPLETE
Bilirubin, UA: NEGATIVE
Glucose, UA: NEGATIVE
Ketones, UA: NEGATIVE
Leukocytes,UA: NEGATIVE
Nitrite, UA: NEGATIVE
Protein,UA: NEGATIVE
RBC, UA: NEGATIVE
Specific Gravity, UA: >1.03 — ABNORMAL HIGH (ref 1.005–1.030)
Urobilinogen, Ur: 0.2 mg/dL (ref 0.2–1.0)
pH, UA: 5 (ref 5.0–7.5)

## 2021-05-03 LAB — MICROSCOPIC EXAMINATION: Bacteria, UA: NONE SEEN

## 2021-05-15 IMAGING — CT CT ABD-PEL WO/W CM
3 of 12 series · 11 of 46 positions shown, 17 images · IV contrast (omnipaque)
Comparison: None.

CLINICAL DATA: Gross hematuria.

EXAM:
CT ABDOMEN AND PELVIS WITHOUT AND WITH CONTRAST
TECHNIQUE: Multidetector CT imaging of the abdomen and pelvis was performed
following the standard protocol before and following the bolus
administration of intravenous contrast.
CONTRAST:  125mL OMNIPAQUE IOHEXOL 300 MG/ML  SOLN

[Series 2: abd without pre 5.00 · axial · non-contrast · 0.79mm/px · z∈[-1534,-1139]mm · 7 of 107 slices shown, 12 images]
[im 14/107  soft-tissue]
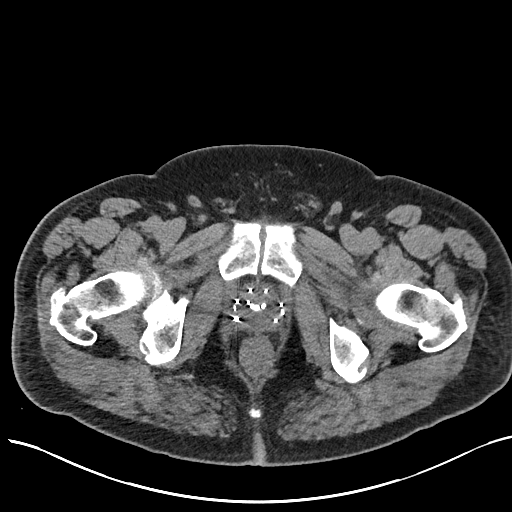
[im 14/107  bone]
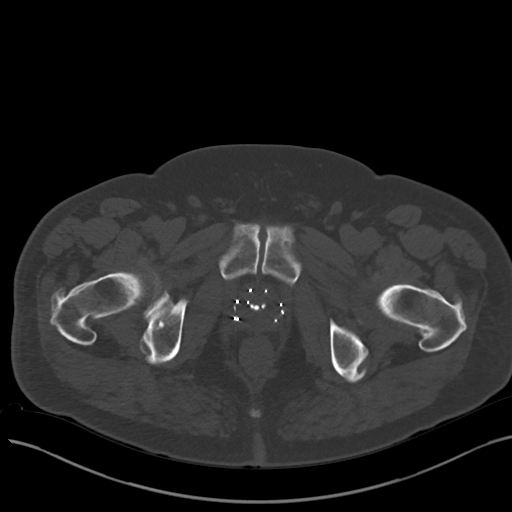
[im 27/107  soft-tissue]
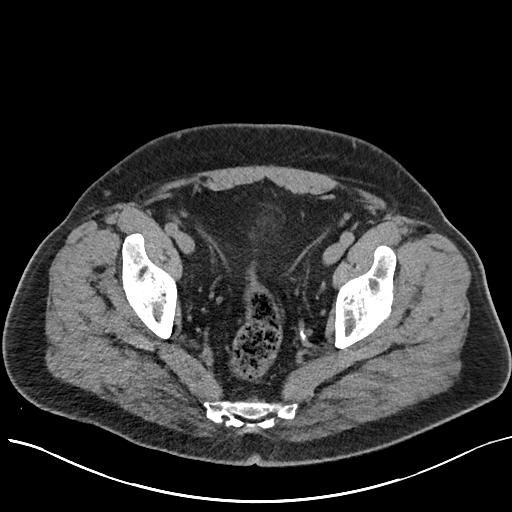
[im 40/107  soft-tissue]
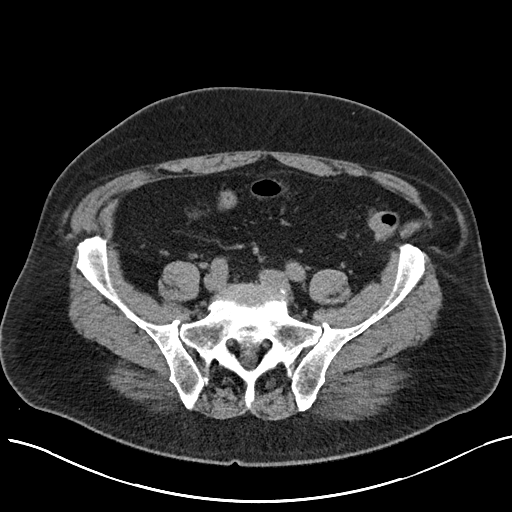
[im 54/107  soft-tissue]
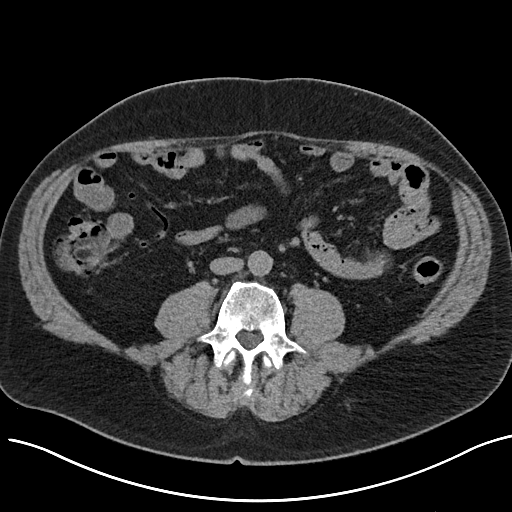
[im 54/107  lung]
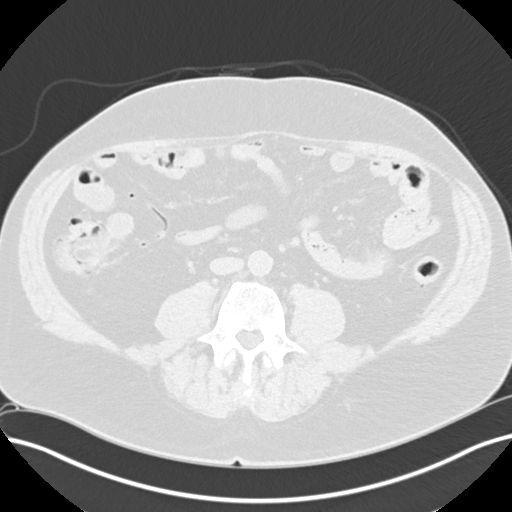
[im 67/107  soft-tissue]
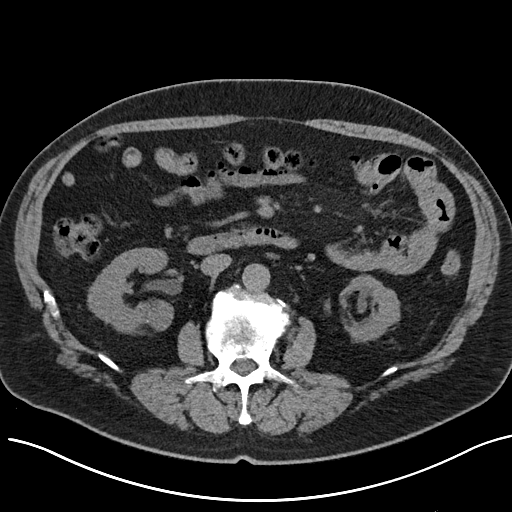
[im 67/107  lung]
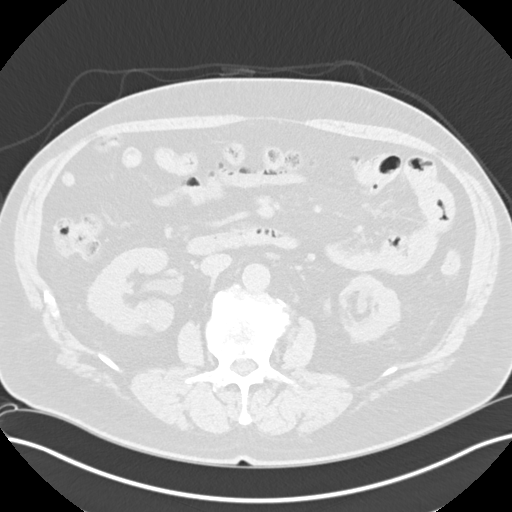
[im 80/107  soft-tissue]
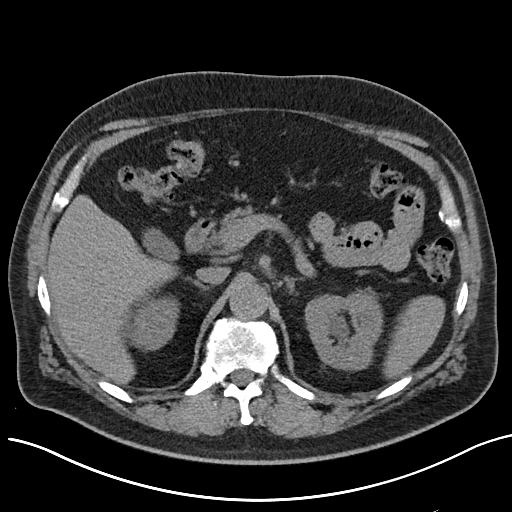
[im 80/107  lung]
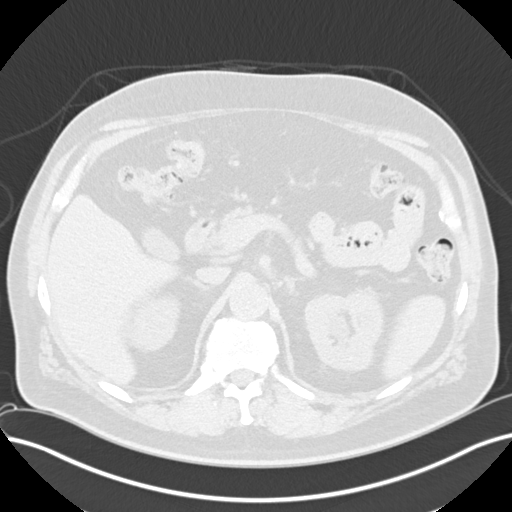
[im 93/107  soft-tissue]
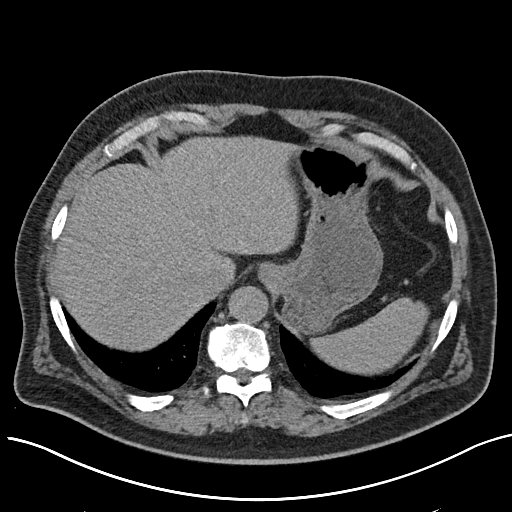
[im 93/107  lung]
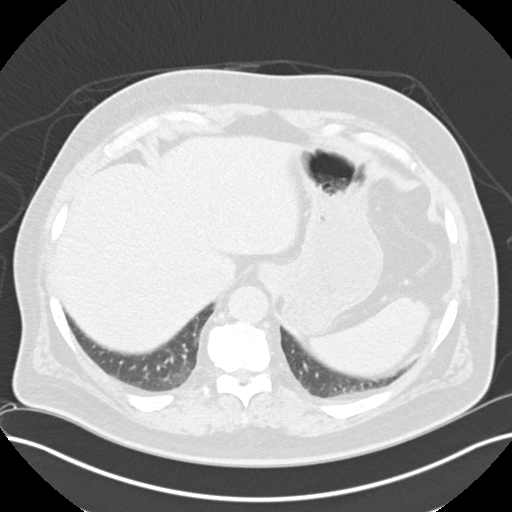

[Series 5: cor without without pre 2.00 cor · coronal · non-contrast · 0.79mm/px · 2 of 159 slices shown, 3 images]
[im 53/159  soft-tissue]
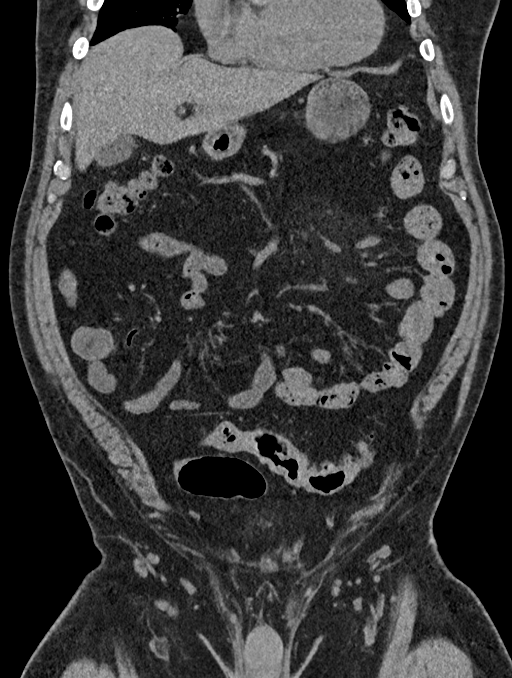
[im 53/159  bone]
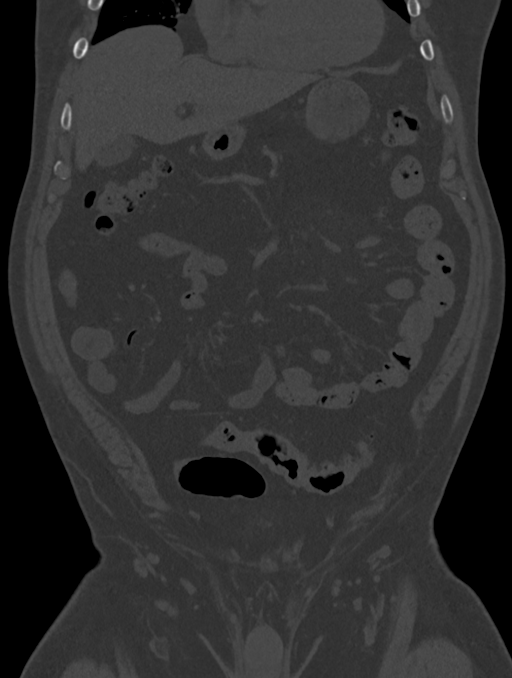
[im 106/159  soft-tissue]
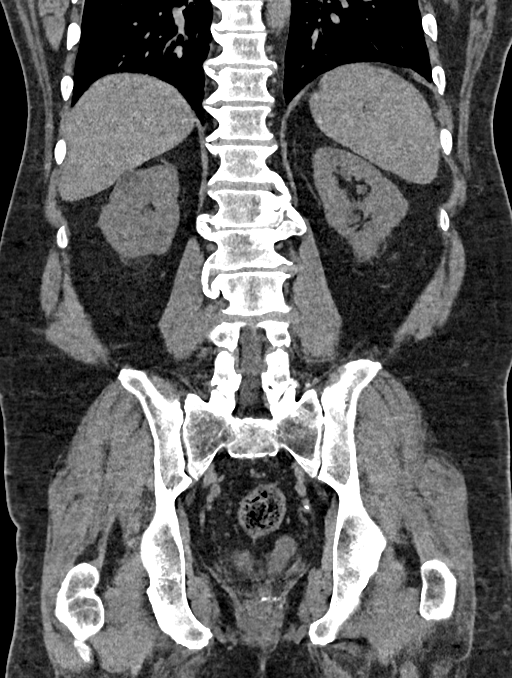

[Series 9: axial with hematuria with 5.00 · axial · 0.79mm/px · z∈[-1524,-1449]mm · 2 of 107 slices shown]
[im 16/107  soft-tissue]
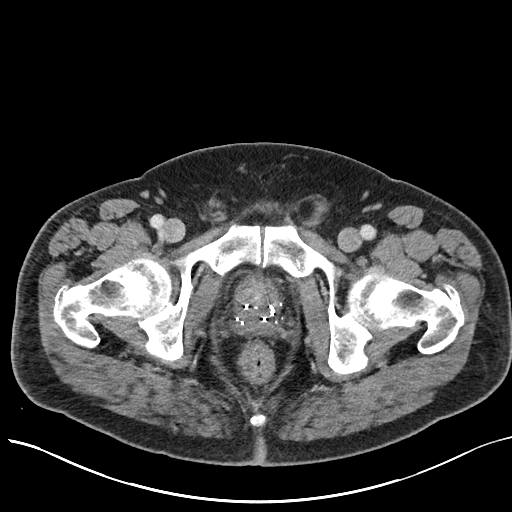
[im 31/107  soft-tissue]
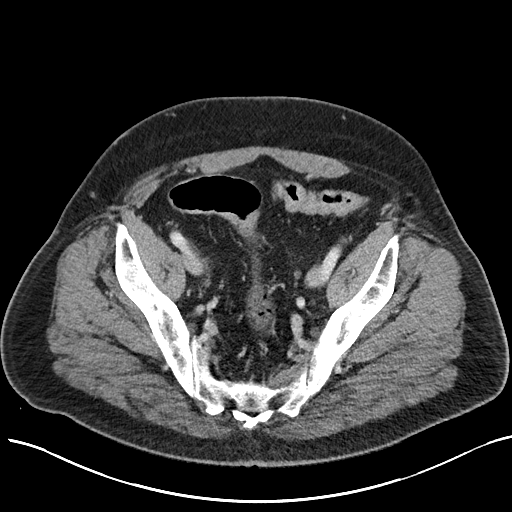

[11 of 46 positions shown; findings below may reference images not displayed]

FINDINGS: Lower chest: Unremarkable.

Hepatobiliary: No suspicious focal abnormality within the liver
parenchyma. There is no evidence for gallstones, gallbladder wall
thickening, or pericholecystic fluid. No intrahepatic or
extrahepatic biliary dilation.

Pancreas: No focal mass lesion. No dilatation of the main duct. No
intraparenchymal cyst. No peripancreatic edema.

Spleen: No splenomegaly. No focal mass lesion.

Adrenals/Urinary Tract: No adrenal nodule or mass.

Precontrast imaging shows several punctate stones in the right
kidney with a dominant nonobstructing 6 mm stone in the lower pole.
Coronal imaging well documents a 2-3 mm nonobstructing interpolar
left renal stone with probable additional punctate nonobstructing
stones in the left kidney. No ureteral stones. Calcified soft tissue
lesion noted posterior left bladder wall.

Imaging after IV contrast administration shows bilateral renal cysts
with tiny cortical hypodensities in each kidney too small to
characterize. There is mild fullness of the left intrarenal
collecting system.

Delayed imaging shows no wall thickening or soft tissue filling
defect in either intrarenal collecting system or renal pelvis. Right
ureter is well opacified and unremarkable. Left ureter is
incompletely opacified but shows no wall thickening or focal mass
lesion.

16 mm polypoid soft tissue lesion noted posterior left bladder wall,
well demonstrated axial image 86/series 17).

Stomach/Bowel: Stomach is unremarkable. No gastric wall thickening.
No evidence of outlet obstruction. Duodenum is normally positioned
as is the ligament of Treitz. No small bowel wall thickening. No
small bowel dilatation. The terminal ileum is normal. The appendix
is normal. No gross colonic mass. No colonic wall thickening.

Vascular/Lymphatic: There is abdominal aortic atherosclerosis
without aneurysm. There is no gastrohepatic or hepatoduodenal
ligament lymphadenopathy. No retroperitoneal or mesenteric
lymphadenopathy. No pelvic sidewall lymphadenopathy.

Reproductive: Brachytherapy seeds noted in the prostate gland.

Other: No intraperitoneal free fluid.

Musculoskeletal: No worrisome lytic or sclerotic osseous
abnormality.
IMPRESSION: 1. 16 mm polypoid soft tissue lesion posterior left bladder wall,
highly suspicious for neoplasm.
2. Mild fullness of the left intrarenal collecting system without an
obstructing stone evident. No substantial left hydroureter.
3. Bilateral renal cysts with tiny cortical hypodensities in each
kidney too small to characterize.
4. Tiny nonobstructing renal stones bilaterally.
5. Aortic Atherosclerosis (IJ9Y4-LKG.G).

## 2021-06-16 ENCOUNTER — Other Ambulatory Visit: Payer: Self-pay | Admitting: Urology

## 2021-07-17 DIAGNOSIS — R42 Dizziness and giddiness: Secondary | ICD-10-CM | POA: Diagnosis not present

## 2021-07-17 DIAGNOSIS — J40 Bronchitis, not specified as acute or chronic: Secondary | ICD-10-CM | POA: Diagnosis not present

## 2021-07-25 ENCOUNTER — Ambulatory Visit
Admission: RE | Admit: 2021-07-25 | Discharge: 2021-07-25 | Disposition: A | Discharge status: Home / Self Care | Payer: Medicare HMO | Attending: Physician Assistant | Admitting: Physician Assistant

## 2021-07-25 ENCOUNTER — Ambulatory Visit
Admission: RE | Admit: 2021-07-25 | Discharge: 2021-07-25 | Disposition: A | Discharge status: Home / Self Care | Payer: Medicare HMO | Source: Ambulatory Visit | Attending: Physician Assistant | Admitting: Physician Assistant

## 2021-07-25 ENCOUNTER — Other Ambulatory Visit: Payer: Self-pay | Admitting: *Deleted

## 2021-07-25 ENCOUNTER — Encounter: Payer: Self-pay | Admitting: Physician Assistant

## 2021-07-25 ENCOUNTER — Other Ambulatory Visit: Payer: Self-pay

## 2021-07-25 ENCOUNTER — Ambulatory Visit: Payer: Medicare HMO | Admitting: Physician Assistant

## 2021-07-25 VITALS — BP 169/86 | HR 71 | Ht 71.0 in | Wt 256.0 lb

## 2021-07-25 DIAGNOSIS — Z87442 Personal history of urinary calculi: Secondary | ICD-10-CM

## 2021-07-25 DIAGNOSIS — N2889 Other specified disorders of kidney and ureter: Secondary | ICD-10-CM | POA: Diagnosis not present

## 2021-07-25 DIAGNOSIS — N200 Calculus of kidney: Secondary | ICD-10-CM | POA: Insufficient documentation

## 2021-07-25 DIAGNOSIS — M47816 Spondylosis without myelopathy or radiculopathy, lumbar region: Secondary | ICD-10-CM | POA: Diagnosis not present

## 2021-07-25 DIAGNOSIS — R3915 Urgency of urination: Secondary | ICD-10-CM | POA: Diagnosis not present

## 2021-07-25 DIAGNOSIS — R109 Unspecified abdominal pain: Secondary | ICD-10-CM

## 2021-07-25 LAB — BLADDER SCAN AMB NON-IMAGING

## 2021-07-25 MED ORDER — MIRABEGRON ER 50 MG PO TB24: 50.0000 mg | ORAL_TABLET | Freq: Every day | ORAL | 11 refills | Status: DC

## 2021-07-25 NOTE — Progress Notes (Signed)
07/25/2021 11:39 AM   Nathan Holland [birth year] [id number]  CC: Chief Complaint  Patient presents with   Nephrolithiasis   HPI: Nathan Holland is a 65 y.o. male with PMH nonmuscle invasive bladder cancer, prostate cancer s/p brachytherapy, nephrolithiasis, and urge incontinence who presents today for evaluation of possible acute stone episode.   Today he reports an approximate 10-day history of left side pain.  He denies fever, chills, nausea, vomiting, dysuria, and gross hematuria.  He states the pain is located in his left low back and is exacerbated with heavy lifting.  He does have a history of back pain.  KUB today with no evident ureteral stones.  On review of his CT hematuria dated 02/21/2020, he has punctate bilateral nephrolithiasis as well as several apparent right-sided parenchymal calcifications.  Additionally, he states that his previously prescribed trospium was never filled by his pharmacy.  He requests an alternative agent today.  He states the pharmacy contacted him to tell him that it would be expensive and there were cost saving alternatives available.  In-office UA and microscopy today pan negative.  PVR 51 mL.  PMH: Past Medical History:  Diagnosis Date   Bladder cancer ([place]) 2021   TURBT with Gemcitabine   GERD (gastroesophageal reflux disease)    History of kidney stones 2011,2012   previous lithotripsies   Prostate cancer ([place]) 2012   prostate with seed placement    Surgical History: Past Surgical History:  Procedure Laterality Date   CYSTOSCOPY W/ RETROGRADES Bilateral 12/25/2020   Procedure: CYSTOSCOPY WITH RETROGRADE PYELOGRAM;  Surgeon: Hollice Espy, MD;  Location: [facility];  Service: Urology;  Laterality: Bilateral;   CYSTOSCOPY WITH BIOPSY N/A 12/25/2020   Procedure: CYSTOSCOPY WITH URETHRAL BIOPSY AND POSSIBLE BLADDER BIOPSY;  Surgeon: Hollice Espy, MD;  Location: [facility];  Service: Urology;  Laterality: N/A;   ETHMOIDECTOMY  Bilateral 03/31/2018   Procedure: ETHMOIDECTOMY  WITH FRONTAL EXPLORATION;  Surgeon: Clyde Canterbury, MD;  Location: [place];  Service: ENT;  Laterality: Bilateral;   EXTRACORPOREAL SHOCK WAVE LITHOTRIPSY  2011,2012   EXTRACORPOREAL SHOCK WAVE LITHOTRIPSY Left 01/02/2017   Procedure: EXTRACORPOREAL SHOCK WAVE LITHOTRIPSY (ESWL);  Surgeon: Royston Cowper, MD;  Location: [facility];  Service: Urology;  Laterality: Left;   IMAGE GUIDED SINUS SURGERY Bilateral 03/31/2018   Procedure: IMAGE GUIDED SINUS SURGERY;  Surgeon: Clyde Canterbury, MD;  Location: [place];  Service: ENT;  Laterality: Bilateral;  NEED STRYKER DISK GAVE STRYKER DISK TO CECE 6-11  KP   INSERTION PROSTATE RADIATION SEED  2012   MAXILLARY ANTROSTOMY Bilateral 03/31/2018   Procedure: MAXILLARY ANTROSTOMY WITH TISSUE REMOVAL;  Surgeon: Clyde Canterbury, MD;  Location: [place];  Service: ENT;  Laterality: Bilateral;   TRANSURETHRAL RESECTION OF BLADDER TUMOR WITH MITOMYCIN-C N/A 02/24/2020   Procedure: TRANSURETHRAL RESECTION OF BLADDER TUMOR WITH Gemcitabine;  Surgeon: Hollice Espy, MD;  Location: [facility];  Service: Urology;  Laterality: N/A;   TRANSURETHRAL RESECTION OF BLADDER TUMOR WITH MITOMYCIN-C N/A 03/23/2020   Procedure: TRANSURETHRAL RESECTION OF BLADDER TUMOR WITH Gemcitabine;  Surgeon: Hollice Espy, MD;  Location: [facility];  Service: Urology;  Laterality: N/A;    Home Medications:  Allergies as of 07/25/2021   No Known Allergies      Medication List        Accurate as of July 25, 2021 11:39 AM. If you have any questions, ask your nurse or doctor.          STOP taking these medications  Trospium Chloride 60 MG Cp24 Stopped by: Debroah Loop, PA-C       TAKE these medications    amoxicillin 875 MG tablet Commonly known as: AMOXIL SMARTSIG:1 Tablet(s) By Mouth Every 12 Hours   meclizine 12.5 MG tablet Commonly known as: ANTIVERT Take 12.5 mg by mouth 3  (three) times daily as needed.   mirabegron ER 50 MG Tb24 tablet Commonly known as: MYRBETRIQ Take 1 tablet (50 mg total) by mouth daily. Started by: Debroah Loop, PA-C   predniSONE 10 MG tablet Commonly known as: DELTASONE Take by mouth.   tamsulosin 0.4 MG Caps capsule Commonly known as: FLOMAX Take 0.4 mg by mouth at bedtime.   vitamin C 100 MG tablet Take 100 mg by mouth daily.   zinc gluconate 50 MG tablet Take 50 mg by mouth daily.        Allergies:  No Known Allergies  Family History: No family history on file.  Social History:   reports that he quit smoking about 15 years ago. His smoking use included cigarettes. He smoked an average of 1 pack per day. He has never used smokeless tobacco. He reports that he does not drink alcohol and does not use drugs.  Physical Exam: BP (!) 169/86   Pulse 71   Ht 5\' 11"  (1.803 m)   Wt 256 lb (116.1 kg)   BMI 35.70 kg/m   Constitutional:  Alert and oriented, no acute distress, nontoxic appearing HEENT: [place], AT Cardiovascular: No clubbing, cyanosis, or edema Respiratory: Normal respiratory effort, no increased work of breathing GU: No CVA tenderness Skin: No rashes, bruises or suspicious lesions Neurologic: Grossly intact, no focal deficits, moving all 4 extremities Psychiatric: Normal mood and affect  Pertinent imaging: KUB, 07/25/2021: CLINICAL DATA:  History of kidney stones   EXAM: ABDOMEN - 1 VIEW   COMPARISON:  02/21/2020   FINDINGS: Scattered large and small bowel gas is noted. Retained fecal material is noted consistent with a degree of constipation. Kidneys are masked by overlying bowel gas and fecal material. There is a vague calcific density identified in the expected region of the lower pole of the left kidney. This may represent a nonobstructing stone. It measures approximately 5 mm. Similar findings are noted over lower pole of right kidney. Prostate therapy seeds are seen. Degenerative  changes of the lumbar spine are noted.   IMPRESSION: Findings suggestive of lower pole nonobstructing renal calculi. CT urography may be helpful for further evaluation.     Electronically Signed   By: Inez Catalina M.D.   On: 07/25/2021 22:47  I personally reviewed the imaging above and note no evident ureteral stones.  Laboratory Data: Results for orders placed or performed in visit on 07/25/21  Microscopic Examination   Urine  Result Value Ref Range   WBC, UA 0-5 0 - 5 /hpf   RBC 0-2 0 - 2 /hpf   Epithelial Cells (non renal) None seen 0 - 10 /hpf   Bacteria, UA None seen None seen/Few  Urinalysis, Complete  Result Value Ref Range   Specific Gravity, UA 1.020 1.005 - 1.030   pH, UA 6.0 5.0 - 7.5   Color, UA Yellow Yellow   Appearance Ur Clear Clear   Leukocytes,UA Negative Negative   Protein,UA Negative Negative/Trace   Glucose, UA Negative Negative   Ketones, UA Negative Negative   RBC, UA Negative Negative   Bilirubin, UA Negative Negative   Urobilinogen, Ur 0.2 0.2 - 1.0 mg/dL   Nitrite, UA Negative  Negative   Microscopic Examination See below:   Bladder Scan (Post Void Residual) in office  Result Value Ref Range   Scan Result 69mL    Assessment & Plan:   1. Flank pain with history of urolithiasis KUB and UA rather reassuring today for acute stone episode.  Given symptomatic exacerbation with heavy lifting and his history of back pain at baseline, I think his pain is more likely to be musculoskeletal in origin.  Patient agrees that this is the more likely option. - Urinalysis, Complete  2. Urgency of urination Patient previously took Myrbetriq 50 mg samples.  We will prescribe these for him today.  If cost prohibitive, okay to try an alternative anticholinergic.  Patient is in agreement with this plan. - mirabegron ER (MYRBETRIQ) 50 MG TB24 tablet; Take 1 tablet (50 mg total) by mouth daily.  Dispense: 30 tablet; Refill: 11 - Bladder Scan (Post Void Residual) in  office  Return if symptoms worsen or fail to improve.  Debroah Loop, PA-C  [facility] [address] [place], [place] [address] [phone]

## 2021-07-26 LAB — URINALYSIS, COMPLETE
Bilirubin, UA: NEGATIVE
Glucose, UA: NEGATIVE
Ketones, UA: NEGATIVE
Leukocytes,UA: NEGATIVE
Nitrite, UA: NEGATIVE
Protein,UA: NEGATIVE
RBC, UA: NEGATIVE
Specific Gravity, UA: 1.02 (ref 1.005–1.030)
Urobilinogen, Ur: 0.2 mg/dL (ref 0.2–1.0)
pH, UA: 6 (ref 5.0–7.5)

## 2021-07-26 LAB — MICROSCOPIC EXAMINATION
Bacteria, UA: NONE SEEN
Epithelial Cells (non renal): NONE SEEN /hpf (ref 0–10)

## 2021-07-30 NOTE — Progress Notes (Signed)
   07/31/2021  CC:  Chief Complaint  Patient presents with   Cysto    HPI: Nathan Holland is a 65 y.o. male with a personal history of nonmuscle invasive bladder cancer high-grade T1/CIS, prostate cancer, nephrolithiasis, and urge incontinence, who presents today for a cystoscopy.   He is s/p induction BCG and maintenance BCG in 04/2021.   He most recently returned the the OR in 12/2020 for urethral dilation, bladder biopsies, bilateral retrograde pyelogram. All were consistent with benign etiologies.   His previous cystoscopy on 03/28/2021 retroflexion was unremarkable.   He was recently seen in office on 07/25/2021 for right flank pain. KUB showed no evidence of ureteral stones. Review of his CT hematuria dated 02/21/2020, he has punctate bilateral nephrolithiasis as well as several apparent right-sided parenchymal calcifications. He was also prescribed mybetriq during this visit for urinary urgency.   His urine cytology previously was atypical urothelial cells.    Vitals:   07/31/21 1413  BP: (!) 156/89  Pulse: 80  NED. A&Ox3.   No respiratory distress   Abd soft, NT, ND Normal phallus with bilateral descended testicles  Cystoscopy Procedure Note  Patient identification was confirmed, informed consent was obtained, and patient was prepped using Betadine solution.  Lidocaine jelly was administered per urethral meatus.     Pre-Procedure: - Inspection reveals a normal caliber ureteral meatus.  Procedure: The flexible cystoscope was introduced   At the level of the prostatic apex, there was obstructing tissue with a stone at the 6 o'clock position consistent with probable prostatic urethral stricture.  I attempted to advance this scope to the 12 o'clock position but created a small false pass.  Ultimately, was able to get a sensor wire through the true lumen and with some difficulty, advance the scope over the wire into the bladder.  The bladder was inspected and no  obvious tumor was present.  There was some mild debris and visualization was slightly suboptimal.  Finally, I left the wire in place and remove the scope.  Is able to get a 34 [place] council tip over [name] wire with some difficulty into the bladder and the bladder was drained.  The balloon was filled with 10 cc of sterile water.  Post-Procedure: - Patient tolerated the procedure well  Assessment/ Plan:  Malignant neoplasm of urinary bladder, unspecified site [facility]) - NED within the bladder  - Urine sent for cytology again today -- Repeat cystoscopy in three months with another course of maintenance BCG after .    2. Urethral stricture  - Prostatic apex with a false pass will leave catheter for three days   Follow-up in 3 months for cystoscopy / Foley removal Friday  I,Nathan Holland,acting as a scribe for Nathan Espy, MD.,have documented all relevant documentation on the behalf of Nathan Espy, MD,as directed by  Nathan Espy, MD while in the presence of Nathan Espy, MD.  I have reviewed the above documentation for accuracy and completeness, and I agree with the above.   Nathan Espy, MD

## 2021-07-31 ENCOUNTER — Ambulatory Visit: Payer: Medicare HMO | Admitting: Urology

## 2021-07-31 ENCOUNTER — Other Ambulatory Visit: Payer: Self-pay

## 2021-07-31 ENCOUNTER — Other Ambulatory Visit: Payer: Self-pay | Admitting: Urology

## 2021-07-31 ENCOUNTER — Encounter: Payer: Self-pay | Admitting: Urology

## 2021-07-31 VITALS — BP 156/89 | HR 80

## 2021-07-31 DIAGNOSIS — C679 Malignant neoplasm of bladder, unspecified: Secondary | ICD-10-CM

## 2021-07-31 LAB — URINALYSIS, COMPLETE
Bilirubin, UA: NEGATIVE
Glucose, UA: NEGATIVE
Ketones, UA: NEGATIVE
Leukocytes,UA: NEGATIVE
Nitrite, UA: NEGATIVE
Protein,UA: NEGATIVE
RBC, UA: NEGATIVE
Specific Gravity, UA: 1.025 (ref 1.005–1.030)
Urobilinogen, Ur: 0.2 mg/dL (ref 0.2–1.0)
pH, UA: 5.5 (ref 5.0–7.5)

## 2021-07-31 LAB — MICROSCOPIC EXAMINATION
Bacteria, UA: NONE SEEN
RBC, Urine: NONE SEEN /hpf (ref 0–2)

## 2021-07-31 MED ORDER — SULFAMETHOXAZOLE-TRIMETHOPRIM 800-160 MG PO TABS
1.0000 | ORAL_TABLET | Freq: Once | ORAL | Status: AC
  Administered 2021-07-31: 1 via ORAL

## 2021-08-02 LAB — CYTOLOGY - NON PAP

## 2021-08-03 ENCOUNTER — Encounter: Payer: Self-pay | Admitting: Physician Assistant

## 2021-08-03 ENCOUNTER — Other Ambulatory Visit: Payer: Self-pay

## 2021-08-03 ENCOUNTER — Ambulatory Visit: Payer: Medicare HMO | Admitting: Physician Assistant

## 2021-08-03 VITALS — BP 153/87 | HR 80 | Ht 71.0 in | Wt 256.0 lb

## 2021-08-03 DIAGNOSIS — N35919 Unspecified urethral stricture, male, unspecified site: Secondary | ICD-10-CM

## 2021-08-03 DIAGNOSIS — R3915 Urgency of urination: Secondary | ICD-10-CM

## 2021-08-03 MED ORDER — OXYBUTYNIN CHLORIDE ER 10 MG PO TB24: 10.0000 mg | ORAL_TABLET | Freq: Every day | ORAL | 5 refills | Status: DC

## 2021-08-03 NOTE — Progress Notes (Signed)
Catheter Removal  Patient is present today for a catheter removal. 16ml of water was drained from the balloon. A 16FR foley cath was removed from the bladder no complications were noted. Patient tolerated well.  Performed by: Gordy Clement, CMA   Additional notes: Myrbetriq cost prohibitive at $100/month; patient requests an alternative medication. Rx sent for oxybutynin XL 10mg  daily.  Follow up: Follow up as scheduled.

## 2021-11-05 NOTE — Progress Notes (Signed)
°  11/06/2021 CC:  Chief Complaint  Patient presents with   Cysto     HPI: Nathan Holland is a 66 y.o. male with a personal history of nonmuscle invasive bladder cancer high-grade T1/CIS, prostate cancer, nephrolithiasis, and urge incontinence, who presents today for a cystoscopy.   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    He is s/p induction BCG and maintenance BCG in 04/2021.    He most recently returned the the OR in 12/2020 for urethral dilation, bladder biopsies, bilateral retrograde pyelogram. All were consistent with benign etiologies.    His previous cystoscopy on 07/31/2021 revealed a urethral stricture.  Last CT urogram 02/2020.     Vitals:   11/06/21 1420  BP: (!) 162/77  Pulse: 83  NED. A&Ox3.   No respiratory distress   Abd soft, NT, ND Normal phallus with bilateral descended testicles  Cystoscopy Procedure Note  Patient identification was confirmed, informed consent was obtained, and patient was prepped using Betadine solution.  Lidocaine jelly was administered per urethral meatus.     Pre-Procedure: - Inspection reveals a normal caliber ureteral meatus.  Procedure: The flexible cystoscope was introduced; stricture at the bulbar urethral with calcification dense.  Pinpoint opening, he declined dilation   Post-Procedure: - Patient tolerated the procedure well   Assessment/ Plan:   Urethral stricture  - He declined dilation in office today in light of interference with work schedule  -We will schedule urethral dilation for Friday he was counseled that he will have a catheter for 3 days.  - He will return the following Monday for foley removal and teaching of self cath to keep urethral patent -He was able to void today  2. History of bladder cancer -Unable to eval bladder today, will schedule as above -Likely consider repeat CT urogram in near future pending cysto results  Return for urethral dilation.    I,Kailey Littlejohn,acting as a Education administrator for Hollice Espy, MD.,have documented all relevant documentation on the behalf of Hollice Espy, MD,as directed by  Hollice Espy, MD while in the presence of Hollice Espy, MD.  I have reviewed the above documentation for accuracy and completeness, and I agree with the above.   Hollice Espy, MD

## 2021-11-06 ENCOUNTER — Other Ambulatory Visit: Payer: Medicare HMO | Admitting: Urology

## 2021-11-06 ENCOUNTER — Other Ambulatory Visit: Payer: Self-pay

## 2021-11-06 ENCOUNTER — Encounter: Payer: Self-pay | Admitting: Urology

## 2021-11-06 ENCOUNTER — Ambulatory Visit: Payer: Medicare HMO | Admitting: Urology

## 2021-11-06 VITALS — BP 162/77 | HR 83 | Ht 71.0 in | Wt 250.0 lb

## 2021-11-06 DIAGNOSIS — C679 Malignant neoplasm of bladder, unspecified: Secondary | ICD-10-CM

## 2021-11-06 LAB — URINALYSIS, COMPLETE
Bilirubin, UA: NEGATIVE
Glucose, UA: NEGATIVE
Ketones, UA: NEGATIVE
Leukocytes,UA: NEGATIVE
Nitrite, UA: NEGATIVE
Protein,UA: NEGATIVE
RBC, UA: NEGATIVE
Specific Gravity, UA: 1.015 (ref 1.005–1.030)
Urobilinogen, Ur: 0.2 mg/dL (ref 0.2–1.0)
pH, UA: 6.5 (ref 5.0–7.5)

## 2021-11-06 LAB — MICROSCOPIC EXAMINATION
Bacteria, UA: NONE SEEN
Epithelial Cells (non renal): NONE SEEN /hpf (ref 0–10)
RBC, Urine: NONE SEEN /hpf (ref 0–2)

## 2021-11-06 MED ORDER — SULFAMETHOXAZOLE-TRIMETHOPRIM 800-160 MG PO TABS
1.0000 | ORAL_TABLET | Freq: Once | ORAL | Status: AC
  Administered 2021-11-06: 1 via ORAL

## 2021-11-15 NOTE — Progress Notes (Signed)
11/16/2021 CC:  Chief Complaint  Patient presents with   Cysto     HPI: Nathan Holland is a 66 y.o. male with a personal history of nonmuscle invasive bladder cancer high-grade T1/CIS, prostate cancer, nephrolithiasis, and urge incontinence, who presents today for a cystoscopy with urethral dilation.   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     He is s/p induction BCG and maintenance BCG in 04/2021.    He most recently returned the the OR in 12/2020 for urethral dilation, bladder biopsies, bilateral retrograde pyelogram. All were consistent with benign etiologies.    His previous cystoscopy on 07/31/2021 revealed a urethral stricture.   Last CT urogram 02/2020.    We attempted cystoscopy in the office earlier on 11/06/2021 however a bulbar urethral stricture was encountered again which would require dilation.  He is amenable to an office dilation but did not want to have a catheter during the week thus presents today for a second attempt at this.  He has been voiding spontaneously without difficulty since then.  Vitals:   11/16/21 1501  BP: 129/76  Pulse: 89   NED. A&Ox3.   No respiratory distress   Abd soft, NT, ND Normal phallus with bilateral descended testicles  Cystoscopy Procedure Note  Patient identification was confirmed, informed consent was obtained, and patient was prepped using Betadine solution.  Lidocaine jelly was administered per urethral meatus.     Pre-Procedure: - Inspection reveals a normal caliber ureteral meatus.  Procedure: The flexible cystoscope was introduced without difficulty -A very dense bulbar urethral stricture was encountered with pinpoint opening.  There is also calcification in this area.  I first attempted with a Super Stiff wire to bypass this area unsuccessfully.  Ended up using a sensor wire and after multiple attempts, was successfully able to navigate this wire through the strictured area into  the bladder.  I was able to railroad my scope over the sensor wire to the bladder. -Upon inspection of the bladder, no obvious tumor was appreciated.  Visualization was slightly suboptimal today although no obvious tumor was identified.  There was no erythema, stones, or any other obvious pathology.  At the end the procedure, all I navigated an 76 [place] council tip with some difficulty over the wire into the bladder.  The balloon was filled with 10 cc of sterile water.  Return of clear yellow urine.  He was administered a single dose of Bactrim x1 today for prophylaxis.   Post-Procedure: - Patient tolerated the procedure well  Assessment/ Plan:  1. Postprocedural male urethral stricture Has required dilation times multiple for recurrent bulbar urethral stricture  We will plan to leave the Foley catheter in place over the weekend and then remove Monday.  I like him to learn CIC Monday to be performed daily to try to keep his urethra patent in order to optimize voiding as well as facilitate future surveillance cystoscopies given his history of high risk bladder cancer.  Ultimately, he has agreed to this. - sulfamethoxazole-trimethoprim (BACTRIM DS) 800-160 MG per tablet 1 tablet  2. Malignant neoplasm of urinary bladder, unspecified site [facility]) We will continue surveillance cystoscopy acute 28-month basis  He is due for maintenance BCG x3 which she is agreeable to.  At this point time, we do have a shortage secondary to national backorder.  We will try to facilitate this for her as soon as available.  In the meantime, we will continue his surveillance cystoscopy scheduled.   Guy Sandifer  Sindia Kowalczyk,acting as a scribe for Hollice Espy, MD.,have documented all relevant documentation on the behalf of Hollice Espy, MD,as directed by  Hollice Espy, MD while in the presence of Hollice Espy, MD.  I have reviewed the above documentation for accuracy and completeness, and I agree with the above.    Hollice Espy, MD

## 2021-11-15 NOTE — Progress Notes (Signed)
°  Catheter Removal  Patient is present today for a catheter removal.  9 ml of water was drained from the balloon. A 18 FR foley cath was removed from the bladder no complications were noted . Patient tolerated well.  Performed by: Zara Council, PA-C   Follow up/ Additional notes: We did not have self cathing supplies in [place] today, but he has cathed himself in the past and feels comfortable with the technique.  He will cath with a 34fr coude Coloplast straight cath twice weekly to start and then eventually once weekly to keep the urethra patent.  I advised him to cath when he needs to void, so he can get urine return to know he is in the correct place.   He will return on 12/18/2021 for cysto with Dr. Erlene Quan.   I,Kailey Littlejohn,acting as a Education administrator for [employer], PA-C.,have documented all relevant documentation on the behalf of [place], PA-C,as directed by  [facility], PA-C while in the presence of Olson Lucarelli, PA-C.  I spent 15 minutes on the day of the encounter to include pre-visit record review, face-to-face time with the patient, and post-visit ordering of tests.

## 2021-11-16 ENCOUNTER — Ambulatory Visit: Payer: Medicare HMO | Admitting: Urology

## 2021-11-16 ENCOUNTER — Other Ambulatory Visit
Admission: RE | Admit: 2021-11-16 | Discharge: 2021-11-16 | Disposition: A | Discharge status: Home / Self Care | Payer: Medicare HMO | Attending: Urology | Admitting: Urology

## 2021-11-16 ENCOUNTER — Other Ambulatory Visit: Payer: Self-pay | Admitting: *Deleted

## 2021-11-16 ENCOUNTER — Encounter: Payer: Self-pay | Admitting: Urology

## 2021-11-16 ENCOUNTER — Other Ambulatory Visit: Payer: Self-pay

## 2021-11-16 VITALS — BP 129/76 | HR 89 | Ht 71.0 in | Wt 260.0 lb

## 2021-11-16 DIAGNOSIS — C679 Malignant neoplasm of bladder, unspecified: Secondary | ICD-10-CM

## 2021-11-16 DIAGNOSIS — N99114 Postprocedural urethral stricture, male, unspecified: Secondary | ICD-10-CM | POA: Diagnosis not present

## 2021-11-16 DIAGNOSIS — N35919 Unspecified urethral stricture, male, unspecified site: Secondary | ICD-10-CM

## 2021-11-16 LAB — URINALYSIS, COMPLETE (UACMP) WITH MICROSCOPIC
Bilirubin Urine: NEGATIVE
Glucose, UA: NEGATIVE mg/dL
Hgb urine dipstick: NEGATIVE
Ketones, ur: NEGATIVE mg/dL
Leukocytes,Ua: NEGATIVE
Nitrite: NEGATIVE
Protein, ur: NEGATIVE mg/dL
Specific Gravity, Urine: 1.025 (ref 1.005–1.030)
pH: 5 (ref 5.0–8.0)

## 2021-11-16 MED ORDER — SULFAMETHOXAZOLE-TRIMETHOPRIM 800-160 MG PO TABS
1.0000 | ORAL_TABLET | Freq: Once | ORAL | Status: AC
  Administered 2021-11-16: 1 via ORAL

## 2021-11-19 ENCOUNTER — Encounter: Payer: Self-pay | Admitting: Urology

## 2021-11-19 ENCOUNTER — Ambulatory Visit: Payer: Medicare HMO | Admitting: Urology

## 2021-11-19 ENCOUNTER — Other Ambulatory Visit: Payer: Self-pay

## 2021-11-19 VITALS — Ht 71.0 in | Wt 260.0 lb

## 2021-11-19 DIAGNOSIS — C679 Malignant neoplasm of bladder, unspecified: Secondary | ICD-10-CM

## 2021-11-19 DIAGNOSIS — N35919 Unspecified urethral stricture, male, unspecified site: Secondary | ICD-10-CM

## 2021-11-20 ENCOUNTER — Telehealth: Payer: Self-pay | Admitting: Family Medicine

## 2021-11-20 NOTE — Telephone Encounter (Signed)
Patient called stating it has been burning when he urinates today. He was seen in [place] on Friday and he had to have dilation in office and he had a catheter placed. He was seen in [place] yesterday for catheter removal. He also took sample catheters to use every few days to keep the stricture open. I spoke to [facility] and she states she is not surprised that it is burning when he urinates. She said if he continues to burn towards the end of the week we can get a urine sample drop off on the lab to be sure he does not have an infection. Patient was happy with this and just wanted to be sure it was normal. I informed him as long as he is not having difficulty urinating and symptoms to do get worse he should be ok.

## 2021-11-27 DIAGNOSIS — R339 Retention of urine, unspecified: Secondary | ICD-10-CM | POA: Diagnosis not present

## 2021-12-10 ENCOUNTER — Ambulatory Visit: Payer: Medicare HMO | Admitting: Urology

## 2021-12-10 ENCOUNTER — Other Ambulatory Visit: Payer: Self-pay

## 2021-12-10 ENCOUNTER — Telehealth: Payer: Self-pay

## 2021-12-10 ENCOUNTER — Encounter: Payer: Self-pay | Admitting: Urology

## 2021-12-10 VITALS — BP 176/99 | HR 78 | Ht 68.0 in | Wt 260.0 lb

## 2021-12-10 DIAGNOSIS — R339 Retention of urine, unspecified: Secondary | ICD-10-CM

## 2021-12-10 DIAGNOSIS — N35919 Unspecified urethral stricture, male, unspecified site: Secondary | ICD-10-CM

## 2021-12-10 LAB — BLADDER SCAN AMB NON-IMAGING: Scan Result: 713

## 2021-12-10 MED ORDER — LEVOFLOXACIN 500 MG PO TABS
500.0000 mg | ORAL_TABLET | Freq: Once | ORAL | Status: AC
  Administered 2021-12-10: 500 mg via ORAL

## 2021-12-10 NOTE — Telephone Encounter (Signed)
Pt called stating he has not been able to self cath since  Friday. Pt was able to urinate very little this morning. Pt has not drank any fluids due to being nervous he is unable to urinate. As per Dr.Stoioff have pt come into clinic this morning. Pt aware and verbalized understanding.  ?

## 2021-12-10 NOTE — Progress Notes (Signed)
Procedure note ? ?Procedure: Complex catheter placement ? ?Diagnosis: Urinary retention ? ?Indications: 66 y.o. male with history of bladder cancer and recurrent urethral stricture.  He underwent dilation by Dr. Erlene Quan 11/16/2021 and was recommended he start intermittent catheterization.  He has been doing once weekly and did it last week without problems.  He tried on 3/3 and was unsuccessful.  He tried again today and was unsuccessful.  Estimated volume by bladder scan was 713 mL ? ?Description: A flexible cystoscope was passed per urethra and stricture noted in the bulbar urethra.  A 0.038 Sensor wire was placed through the cystoscope and through the stricture however after a few centimeters resistance was met.  The cystoscope was able to be advanced over the wire into the prostatic urethra and the scope was then advanced in the bladder.  The wire was further advanced and the cystoscope was removed.  A 16 [place] council catheter was able to be placed over the wire with moderate resistance met in the region of the bulbar urethra.  800 mL of clear urine was obtained. ? ?He was given Levaquin 500 mg post dilation ? ?Recommendation: Indwelling Foley 5-7 days ? ? ?John Giovanni, MD ?

## 2021-12-11 ENCOUNTER — Telehealth: Payer: Self-pay

## 2021-12-11 NOTE — Telephone Encounter (Signed)
-----   Message from Hollice Espy, MD sent at 12/10/2021  5:38 PM EST ----- ?Regarding: RE: Follow-up ?Yes, that sounds like a good plan. ? ?Braeson Rupe, can you let him know? ? ?Hollice Espy ? ? ? ?----- Message ----- ?From: Abbie Sons, MD ?Sent: 12/10/2021   3:07 PM EST ?To: Hollice Espy, MD ?Subject: Follow-up                                     ? ?He had been catheterizing once weekly without problems but was unable to cath on Friday or over the weekend.  I put in a council over a wire.  Did not realize he had a follow-up scheduled with you next week for cystoscopy.  Do you want him to just keep the catheter in until the appointment on 3/14?  Thanks ? ? ?

## 2021-12-11 NOTE — Telephone Encounter (Signed)
Pt verbalized understanding.

## 2021-12-16 NOTE — Progress Notes (Incomplete)
° °  12/16/21 CC: No chief complaint on file.     HPI: Nathan Holland is a 66 y.o. male with a personal history of nonmuscle invasive bladder cancer high-grade T1/CIS, prostate cancer, nephrolithiasis, and urge incontinence, who presents today for a cystoscopy.   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     He is s/p induction BCG and maintenance BCG in 04/2021.    He most recently returned the the OR in 12/2020 for urethral dilation, bladder biopsies, bilateral retrograde pyelogram. All were consistent with benign etiologies.   He underwent a cystoscopy with urethral dilation on 11/16/2021.   His catheter was removed on 11/19/2021 by Zara Council, PA-c.   He was seen back in clinic on 1/61/0960 for a complicated catheter placement with Dr. Bernardo Heater.  Estimated volume by bladder scan was 744m. He was given Levaquin post dilation.   There were no vitals filed for this visit. NED. A&Ox3.   No respiratory distress   Abd soft, NT, ND Normal phallus with bilateral descended testicles  Cystoscopy Procedure Note  Patient identification was confirmed, informed consent was obtained, and patient was prepped using Betadine solution.  Lidocaine jelly was administered per urethral meatus.     Pre-Procedure: - Inspection reveals a normal caliber ureteral meatus.  Procedure: The flexible cystoscope was introduced without difficulty - No urethral strictures/lesions are present. - {Blank multiple:19197::"Enlarged","Surgically absent","Normal"} prostate *** - {Blank multiple:19197::"Normal","Elevated","Tight"} bladder neck - Bilateral ureteral orifices identified - Bladder mucosa  reveals no ulcers, tumors, or lesions - No bladder stones - No trabeculation  Retroflexion shows ***   Post-Procedure: - Patient tolerated the procedure well   Assessment/ Plan:   No follow-ups on file.  I,Kailey Littlejohn,acting as a sEducation administratorfor AHollice Espy  MD.,have documented all relevant documentation on the behalf of AHollice Espy MD,as directed by  AHollice Espy MD while in the presence of AHollice Espy MD.

## 2021-12-18 ENCOUNTER — Ambulatory Visit: Payer: Medicare HMO | Admitting: Urology

## 2021-12-18 ENCOUNTER — Other Ambulatory Visit: Payer: Self-pay

## 2021-12-18 ENCOUNTER — Encounter: Payer: Self-pay | Admitting: Urology

## 2021-12-18 VITALS — BP 110/78 | HR 102 | Ht 68.0 in | Wt 260.0 lb

## 2021-12-18 DIAGNOSIS — R3915 Urgency of urination: Secondary | ICD-10-CM

## 2021-12-18 MED ORDER — CEPHALEXIN 250 MG PO CAPS
500.0000 mg | ORAL_CAPSULE | Freq: Once | ORAL | Status: AC
  Administered 2021-12-18: 500 mg via ORAL

## 2021-12-18 NOTE — Progress Notes (Signed)
Catheter Removal ? ?Patient is present today for a catheter removal.  49m of water was drained from the balloon. A 16FR foley cath was removed from the bladder. Catheter was not draining, once ballon was deflated patient had significant leakage.  ? ?Performed by: RVerlene Mayer CMA  ? ? ? ?

## 2021-12-18 NOTE — Patient Instructions (Addendum)
Self cath every other day  ? ? ? ? ? ?Step 1 Get all of your supplies ready and place near you. ?Step 2 Wash your hands, or put on gloves. ?Step 3 Wash around the tip of your penis with warm antibacterial soapy water. ?Step 4 Take catheter out of package and drain the lubricant over toilet. ?Step 5 While holding the penis at a 45 degree angle from the stomach in one hand and the catheter in the other hand  ?Step 6 Insert the catheter slowly into your urethra. If there is resistance when the catheter reaches the sphincter muscle,  ?            take a deep breath and gently apply steady pressure.  ?            DO NOT FORCE THE CATHETER ?Step 7 When the urine begins to flow insert another inch and lower penis. Allow the urine to flow into the toilet. ?Step 8 When the flow of urine stops, slowly remove the catheter. ? ?

## 2022-01-18 ENCOUNTER — Telehealth: Payer: Self-pay

## 2022-01-18 NOTE — Telephone Encounter (Signed)
Pt scheduled for 3 mBCG appts. Pt confirmed.  ?

## 2022-01-18 NOTE — Telephone Encounter (Signed)
-----   Message from Noralyn Pick sent at 01/08/2022  8:54 AM EDT ----- ?Regarding: BCG Maintenance X 3 ?Per Dr. Erlene Quan,  this patient needs BCG maintenance x 3 but he is very low on priority scale, whenever we get in extra is fine . ? ?Thank you!! ? ? ? ?

## 2022-01-29 ENCOUNTER — Telehealth: Payer: Self-pay

## 2022-01-29 ENCOUNTER — Ambulatory Visit: Payer: Medicare HMO | Admitting: Physician Assistant

## 2022-01-29 ENCOUNTER — Other Ambulatory Visit: Payer: Self-pay | Admitting: Physician Assistant

## 2022-01-29 DIAGNOSIS — D494 Neoplasm of unspecified behavior of bladder: Secondary | ICD-10-CM | POA: Diagnosis not present

## 2022-01-29 DIAGNOSIS — N35919 Unspecified urethral stricture, male, unspecified site: Secondary | ICD-10-CM

## 2022-01-29 DIAGNOSIS — C679 Malignant neoplasm of bladder, unspecified: Secondary | ICD-10-CM

## 2022-01-29 LAB — MICROSCOPIC EXAMINATION: Bacteria, UA: NONE SEEN

## 2022-01-29 LAB — URINALYSIS, COMPLETE
Bilirubin, UA: NEGATIVE
Glucose, UA: NEGATIVE
Ketones, UA: NEGATIVE
Leukocytes,UA: NEGATIVE
Nitrite, UA: NEGATIVE
Protein,UA: NEGATIVE
RBC, UA: NEGATIVE
Specific Gravity, UA: >1.03 — ABNORMAL HIGH (ref 1.005–1.030)
Urobilinogen, Ur: 0.2 mg/dL (ref 0.2–1.0)
pH, UA: 5.5 (ref 5.0–7.5)

## 2022-01-29 MED ORDER — BCG LIVE 50 MG IS SUSR
3.2400 mL | Freq: Once | INTRAVESICAL | Status: AC
  Administered 2022-01-29: 81 mg via INTRAVESICAL

## 2022-01-29 NOTE — Progress Notes (Signed)
[facility] Posting Form  ? ?Surgery Date/Time: Date: 01/31/2022 ? ?Surgeon: Dr. Hollice Espy, MD ? ?Surgery Location: Day Surgery ? ?Inpt ( No  )   Outpt (Yes)   Obs ( No  )  ? ?Diagnosis: Urethral Stricture N35.919 ? ?-CPT: 63817 ? ?Surgery: Cystoscopy with Urethral Dilation ? ?Stop Anticoagulations: N/A ? ?Cardiac/Medical/Pulmonary Clearance needed: no ? ?*Orders entered into EPIC  Date: 01/29/22  ? ?*Case booked in [place]  Date: 01/29/22 ? ?*Notified pt of Surgery: Date: 01/29/22 ? ?PRE-OP UA & CX: no ? ?*Placed into Prior Authorization Work Que Date: 01/29/22 ? ? ?Assistant/laser/rep:No ? ? ? ? ? ? ? ? ? ? ? ? ? ? ? ?

## 2022-01-29 NOTE — Progress Notes (Signed)
Surgical Physician Order Form [facility] ? ?Dr. Erlene Quan * Scheduling expectation :  01/31/2022 ? ?*Length of Case:  ? ?*Clearance needed: no ? ?*Anticoagulation Instructions: N/A ? ?*Aspirin Instructions: N/A ? ?*Post-op visit Date/Instructions:   TBD ? ?*Diagnosis: Urethral Stricture ? ?*Procedure:     Cysto w/urethral dilation (58727) ? ? ?Additional orders: N/A ? ?-Admit type: OUTpatient ? ?-Anesthesia: General ? ?-VTE Prophylaxis Standing Order SCD?s    ?   ?Other:  ? ?-Standing Lab Orders Per Anesthesia   ? ?Lab other: None ? ?-Standing Test orders EKG/Chest x-ray per Anesthesia      ? ?Test other:  ? ?- Medications:  Ancef 2gm IV ? ?-Other orders:  N/A ? ? ? ?   ?

## 2022-01-29 NOTE — Telephone Encounter (Signed)
I spoke with Mr. Nathan Holland while in office today 01/29/2022. We have discussed possible surgery dates and Thursday April 27th, 2023 was agreed upon by all parties. Patient given information about surgery date, what to expect pre-operatively and post operatively.  ?We discussed that a Pre-Admission Testing office will be calling to set up the pre-op visit that will take place prior to surgery, and that these appointments are typically done over the phone with a Pre-Admissions RN.  ?Informed patient that our office will communicate any additional care to be provided after surgery. Patients questions or concerns were discussed during our call. Advised to call our office should there be any additional information, questions or concerns that arise. Patient verbalized understanding.  ? ?

## 2022-01-29 NOTE — Progress Notes (Signed)
Patient presented to clinic today for maintenance BCG # 1 of 3.  He reports that he has been unable to CIC for the past 3 weeks due to resistance met and bleeding with insertion of the catheter. ? ?I attempted to place a 14 French coud? red rubber catheter as well as a 12 French silicone coud? catheter in clinic today but was unable to do so.  I met significant resistance at the level of the bulbar urethra, where patient has a known history of stricture. ? ?We scanned his bladder with a PVR of 3 mL. ? ?No indication for urgent intervention at this time, however I discussed this case with Dr. Brandon, who recommends proceeding to the OR for cystoscopy and urethral dilation in 2 days.  I discussed with the patient that depending on her findings, he may need to keep the urinary catheter in place for the next 3 weeks while we perform his maintenance BCG series.  He expressed understanding. ? ?BCG instillation deferred today since we are unable to successfully catheterize him. ? ?Samantha Vaillancourt, PA-C ?01/29/22 ?4:27 PM ? ?I spent 25 minutes on the day of the encounter to include pre-visit record review, face-to-face time with the patient, and post-visit ordering of tests.  ?

## 2022-01-29 NOTE — H&P (View-Only) (Signed)
Patient presented to clinic today for maintenance BCG # 1 of 3.  He reports that he has been unable to CIC for the past 3 weeks due to resistance met and bleeding with insertion of the catheter. ? ?I attempted to place a 14 French coud? red rubber catheter as well as a 12 French silicone coud? catheter in clinic today but was unable to do so.  I met significant resistance at the level of the bulbar urethra, where patient has a known history of stricture. ? ?We scanned his bladder with a PVR of 3 mL. ? ?No indication for urgent intervention at this time, however I discussed this case with Dr. Brandon, who recommends proceeding to the OR for cystoscopy and urethral dilation in 2 days.  I discussed with the patient that depending on her findings, he may need to keep the urinary catheter in place for the next 3 weeks while we perform his maintenance BCG series.  He expressed understanding. ? ?BCG instillation deferred today since we are unable to successfully catheterize him. ? ?Arayah Krouse, PA-C ?01/29/22 ?4:27 PM ? ?I spent 25 minutes on the day of the encounter to include pre-visit record review, face-to-face time with the patient, and post-visit ordering of tests.  ?

## 2022-01-30 ENCOUNTER — Other Ambulatory Visit: Payer: Medicare HMO | Admitting: Urology

## 2022-01-30 ENCOUNTER — Other Ambulatory Visit: Payer: Self-pay

## 2022-01-30 ENCOUNTER — Encounter
Admission: RE | Admit: 2022-01-30 | Discharge: 2022-01-30 | Disposition: A | Discharge status: Home / Self Care | Payer: Medicare HMO | Source: Ambulatory Visit | Attending: Urology | Admitting: Urology

## 2022-01-30 DIAGNOSIS — Z01818 Encounter for other preprocedural examination: Secondary | ICD-10-CM

## 2022-01-30 HISTORY — DX: Unspecified osteoarthritis, unspecified site: M19.90

## 2022-01-30 MED ORDER — ORAL CARE MOUTH RINSE: 15.0000 mL | Freq: Once | OROMUCOSAL | Status: AC

## 2022-01-30 MED ORDER — CHLORHEXIDINE GLUCONATE 0.12 % MT SOLN: 15.0000 mL | Freq: Once | OROMUCOSAL | Status: AC

## 2022-01-30 MED ORDER — FAMOTIDINE 20 MG PO TABS
20.0000 mg | ORAL_TABLET | Freq: Once | ORAL | Status: AC
Start: 2022-01-30 — End: 2022-01-31

## 2022-01-30 MED ORDER — LACTATED RINGERS IV SOLN
INTRAVENOUS | Status: DC
Start: 2022-01-30 — End: 2022-01-31

## 2022-01-30 MED ORDER — CEFAZOLIN SODIUM-DEXTROSE 2-4 GM/100ML-% IV SOLN
2.0000 g | INTRAVENOUS | Status: AC
  Administered 2022-01-31: 2 g via INTRAVENOUS

## 2022-01-30 NOTE — Patient Instructions (Signed)
Your procedure is scheduled on:01-31-22 Thursday ?Report to the Registration Desk on the 1st floor of the [facility].Then proceed to the 2nd floor Surgery Desk in the [facility] at 9:30 am ? ?REMEMBER: ?Instructions that are not followed completely may result in serious medical risk, up to and including death; or upon the discretion of your surgeon and anesthesiologist your surgery may need to be rescheduled. ? ?Do not eat food OR drink any liquids after midnight the night before surgery.  ?No gum chewing, lozengers or hard candies. ? ?Do NOT take any medication the day of surgery ? ?One week prior to surgery: ?Stop Anti-inflammatories (NSAIDS) such as Advil, Aleve, Ibuprofen, Motrin, Naproxen, Naprosyn and Aspirin based products such as Excedrin, Goodys Powder, BC Powder.You may however, take Tylenol if needed for pain up until the day of surgery. ? ?Stop ANY OVER THE COUNTER supplements/vitamins NOW (4-26) until after surgery (Vitamin C, Zinc) ? ?No Alcohol for 24 hours before or after surgery. ? ?No Smoking including e-cigarettes for 24 hours prior to surgery.  ?No chewable tobacco products for at least 6 hours prior to surgery.  ?No nicotine patches on the day of surgery. ? ?Do not use any "recreational" drugs for at least a week prior to your surgery.  ?Please be advised that the combination of cocaine and anesthesia may have negative outcomes, up to and including death. ?If you test positive for cocaine, your surgery will be cancelled. ? ?On the morning of surgery brush your teeth with toothpaste and water, you may rinse your mouth with mouthwash if you wish. ?Do not swallow any toothpaste or mouthwash. ? ?Do not wear jewelry, make-up, hairpins, clips or nail polish. ? ?Do not wear lotions, powders, or perfumes.  ? ?Do not shave body from the neck down 48 hours prior to surgery just in case you cut yourself which could leave a site for infection.  ?Also, freshly shaved skin may become irritated if  using the CHG soap. ? ?Contact lenses, hearing aids and dentures may not be worn into surgery. ? ?Do not bring valuables to the hospital. [facility] is not responsible for any missing/lost belongings or valuables.  ? ?Notify your doctor if there is any change in your medical condition (cold, fever, infection). ? ?Wear comfortable clothing (specific to your surgery type) to the hospital. ? ?After surgery, you can help prevent lung complications by doing breathing exercises.  ?Take deep breaths and cough every 1-2 hours. Your doctor may order a device called an Incentive Spirometer to help you take deep breaths. ?When coughing or sneezing, hold a pillow firmly against your incision with both hands. This is called ?splinting.? Doing this helps protect your incision. It also decreases belly discomfort. ? ?If you are being admitted to the hospital overnight, leave your suitcase in the car. ?After surgery it may be brought to your room. ? ?If you are being discharged the day of surgery, you will not be allowed to drive home. ?You will need a responsible adult (18 years or older) to drive you home and stay with you that night.  ? ?If you are taking public transportation, you will need to have a responsible adult (18 years or older) with you. ?Please confirm with your physician that it is acceptable to use public transportation.  ? ?Please call the [facility] Dept. at [phone] if you have any questions about these instructions. ? ?Surgery Visitation Policy: ? ?Patients undergoing a surgery or procedure may have two family members  or support persons with them as long as the person is not COVID-19 positive or experiencing its symptoms.  ?

## 2022-01-31 ENCOUNTER — Ambulatory Visit: Payer: Medicare HMO | Admitting: Certified Registered"

## 2022-01-31 ENCOUNTER — Ambulatory Visit
Admission: RE | Admit: 2022-01-31 | Discharge: 2022-01-31 | Disposition: A | Discharge status: Home / Self Care | Payer: Medicare HMO | Source: Ambulatory Visit | Attending: Urology | Admitting: Urology

## 2022-01-31 ENCOUNTER — Encounter: Payer: Self-pay | Admitting: Urology

## 2022-01-31 ENCOUNTER — Other Ambulatory Visit: Payer: Self-pay

## 2022-01-31 ENCOUNTER — Encounter
Admission: RE | Disposition: A | Discharge status: Home / Self Care | Payer: Self-pay | Source: Ambulatory Visit | Attending: Urology

## 2022-01-31 DIAGNOSIS — K219 Gastro-esophageal reflux disease without esophagitis: Secondary | ICD-10-CM | POA: Insufficient documentation

## 2022-01-31 DIAGNOSIS — Z01818 Encounter for other preprocedural examination: Secondary | ICD-10-CM

## 2022-01-31 DIAGNOSIS — Z8551 Personal history of malignant neoplasm of bladder: Secondary | ICD-10-CM

## 2022-01-31 DIAGNOSIS — N35919 Unspecified urethral stricture, male, unspecified site: Secondary | ICD-10-CM | POA: Diagnosis not present

## 2022-01-31 DIAGNOSIS — M1990 Unspecified osteoarthritis, unspecified site: Secondary | ICD-10-CM | POA: Diagnosis not present

## 2022-01-31 DIAGNOSIS — R54 Age-related physical debility: Secondary | ICD-10-CM | POA: Diagnosis not present

## 2022-01-31 DIAGNOSIS — Z8546 Personal history of malignant neoplasm of prostate: Secondary | ICD-10-CM | POA: Diagnosis not present

## 2022-01-31 DIAGNOSIS — Z87891 Personal history of nicotine dependence: Secondary | ICD-10-CM | POA: Insufficient documentation

## 2022-01-31 DIAGNOSIS — I493 Ventricular premature depolarization: Secondary | ICD-10-CM | POA: Diagnosis not present

## 2022-01-31 DIAGNOSIS — N35916 Unspecified urethral stricture, male, overlapping sites: Secondary | ICD-10-CM

## 2022-01-31 HISTORY — PX: CYSTOSCOPY WITH URETHRAL DILATATION: SHX5125

## 2022-01-31 LAB — BASIC METABOLIC PANEL
Anion gap: 5 (ref 5–15)
BUN: 18 mg/dL (ref 8–23)
CO2: 24 mmol/L (ref 22–32)
Calcium: 8.8 mg/dL — ABNORMAL LOW (ref 8.9–10.3)
Chloride: 108 mmol/L (ref 98–111)
Creatinine, Ser: 0.67 mg/dL (ref 0.61–1.24)
GFR, Estimated: >60 mL/min (ref >60)
Glucose, Bld: 119 mg/dL — ABNORMAL HIGH (ref 70–99)
Potassium: 4.2 mmol/L (ref 3.5–5.1)
Sodium: 137 mmol/L (ref 135–145)

## 2022-01-31 SURGERY — CYSTOSCOPY, WITH URETHRAL DILATION
Anesthesia: General

## 2022-01-31 MED ORDER — DEXAMETHASONE SODIUM PHOSPHATE 10 MG/ML IJ SOLN
INTRAMUSCULAR | Status: AC
  Filled 2022-01-31: qty 1

## 2022-01-31 MED ORDER — MIDAZOLAM HCL 2 MG/2ML IJ SOLN
INTRAMUSCULAR | Status: AC
  Filled 2022-01-31: qty 2

## 2022-01-31 MED ORDER — FENTANYL CITRATE (PF) 100 MCG/2ML IJ SOLN
INTRAMUSCULAR | Status: AC
  Filled 2022-01-31: qty 2

## 2022-01-31 MED ORDER — ONDANSETRON HCL 4 MG/2ML IJ SOLN
INTRAMUSCULAR | Status: AC
  Filled 2022-01-31: qty 2

## 2022-01-31 MED ORDER — LIDOCAINE HCL (CARDIAC) PF 100 MG/5ML IV SOSY
PREFILLED_SYRINGE | INTRAVENOUS | Status: DC | PRN
Start: 2022-01-31 — End: 2022-01-31
  Administered 2022-01-31: 100 mg via INTRAVENOUS

## 2022-01-31 MED ORDER — FENTANYL CITRATE (PF) 100 MCG/2ML IJ SOLN
INTRAMUSCULAR | Status: DC | PRN
  Administered 2022-01-31 (×4): 25 ug via INTRAVENOUS

## 2022-01-31 MED ORDER — ONDANSETRON HCL 4 MG/2ML IJ SOLN
INTRAMUSCULAR | Status: DC | PRN
  Administered 2022-01-31: 4 mg via INTRAVENOUS

## 2022-01-31 MED ORDER — DEXAMETHASONE SODIUM PHOSPHATE 10 MG/ML IJ SOLN
INTRAMUSCULAR | Status: DC | PRN
  Administered 2022-01-31: 10 mg via INTRAVENOUS

## 2022-01-31 MED ORDER — FAMOTIDINE 20 MG PO TABS
ORAL_TABLET | ORAL | Status: AC
  Administered 2022-01-31: 20 mg via ORAL
  Filled 2022-01-31: qty 1

## 2022-01-31 MED ORDER — MIDAZOLAM HCL 2 MG/2ML IJ SOLN
INTRAMUSCULAR | Status: DC | PRN
  Administered 2022-01-31: 2 mg via INTRAVENOUS

## 2022-01-31 MED ORDER — FENTANYL CITRATE (PF) 100 MCG/2ML IJ SOLN: 25.0000 ug | INTRAMUSCULAR | Status: DC | PRN

## 2022-01-31 MED ORDER — PROPOFOL 10 MG/ML IV BOLUS
INTRAVENOUS | Status: AC
  Filled 2022-01-31: qty 20

## 2022-01-31 MED ORDER — PROPOFOL 10 MG/ML IV BOLUS
INTRAVENOUS | Status: DC | PRN
  Administered 2022-01-31: 150 mg via INTRAVENOUS

## 2022-01-31 MED ORDER — OXYCODONE HCL 5 MG PO TABS: 5.0000 mg | ORAL_TABLET | Freq: Once | ORAL | Status: DC | PRN

## 2022-01-31 MED ORDER — CHLORHEXIDINE GLUCONATE 0.12 % MT SOLN
OROMUCOSAL | Status: AC
  Administered 2022-01-31: 15 mL via OROMUCOSAL
  Filled 2022-01-31: qty 15

## 2022-01-31 MED ORDER — CEFAZOLIN SODIUM-DEXTROSE 2-4 GM/100ML-% IV SOLN
INTRAVENOUS | Status: AC
  Filled 2022-01-31: qty 100

## 2022-01-31 MED ORDER — OXYCODONE HCL 5 MG/5ML PO SOLN: 5.0000 mg | Freq: Once | ORAL | Status: DC | PRN

## 2022-01-31 MED ORDER — LIDOCAINE HCL (PF) 2 % IJ SOLN
INTRAMUSCULAR | Status: AC
  Filled 2022-01-31: qty 5

## 2022-01-31 SURGICAL SUPPLY — 28 items
BAG DRN RND TRDRP ANRFLXCHMBR (UROLOGICAL SUPPLIES) ×1
BAG URINE DRAIN 2000ML AR STRL (UROLOGICAL SUPPLIES) ×2 IMPLANT
BALLN OPTILUME DCB 24X5X75 (BALLOONS)
BALLN OPTILUME DCB 30X3X75 (BALLOONS)
BALLN OPTILUME DCB 30X5X75 (BALLOONS) ×2
BALLOON OPTILUME DCB 24X5X75 (BALLOONS) IMPLANT
BALLOON OPTILUME DCB 30X3X75 (BALLOONS) IMPLANT
BALLOON OPTILUME DCB 30X5X75 (BALLOONS) IMPLANT
CATH FOL 2WAY LX 16X5 (CATHETERS) IMPLANT
CATH FOL 2WAY LX 18X30 (CATHETERS) IMPLANT
CATH FOL 2WAY LX 24X30 (CATHETERS) ×1 IMPLANT
CATH URETHRAL DIL 7.0X29 (CATHETERS) ×2 IMPLANT
DEVICE INFLATION ATRION QL4015 (MISCELLANEOUS) ×1 IMPLANT
ELECT REM PT RETURN 9FT ADLT (ELECTROSURGICAL)
ELECTRODE REM PT RTRN 9FT ADLT (ELECTROSURGICAL) IMPLANT
GAUZE 4X4 16PLY ~~LOC~~+RFID DBL (SPONGE) ×4 IMPLANT
GLOVE BIO SURGEON STRL SZ 6.5 (GLOVE) ×2 IMPLANT
GOWN STRL REUS W/ TWL LRG LVL3 (GOWN DISPOSABLE) ×2 IMPLANT
GOWN STRL REUS W/TWL LRG LVL3 (GOWN DISPOSABLE) ×4
GUIDEWIRE STR DUAL SENSOR (WIRE) ×2 IMPLANT
MANIFOLD NEPTUNE II (INSTRUMENTS) ×1 IMPLANT
PACK CYSTO AR (MISCELLANEOUS) ×2 IMPLANT
SET CYSTO W/LG BORE CLAMP LF (SET/KITS/TRAYS/PACK) ×2 IMPLANT
SYR 10ML LL (SYRINGE) ×2 IMPLANT
SYR 30ML LL (SYRINGE) ×2 IMPLANT
WATER STERILE IRR 1000ML POUR (IV SOLUTION) ×1 IMPLANT
WATER STERILE IRR 3000ML UROMA (IV SOLUTION) ×1 IMPLANT
WATER STERILE IRR 500ML POUR (IV SOLUTION) ×2 IMPLANT

## 2022-01-31 NOTE — Anesthesia Postprocedure Evaluation (Signed)
Anesthesia Post Note ? ?Patient: Nathan Holland ? ?Procedure(s) Performed: CYSTOSCOPY WITH URETHRAL DILATATION ? ?Patient location during evaluation: Endoscopy ?Anesthesia Type: General ?Level of consciousness: awake and alert ?Pain management: pain level controlled ?Vital Signs Assessment: post-procedure vital signs reviewed and stable ?Respiratory status: spontaneous breathing, nonlabored ventilation, respiratory function stable and patient connected to nasal cannula oxygen ?Cardiovascular status: blood pressure returned to baseline and stable ?Postop Assessment: no apparent nausea or vomiting ?Anesthetic complications: no ? ? ?No notable events documented. ? ? ?Last Vitals:  ?Vitals:  ? 01/31/22 1130 01/31/22 1151  ?BP: 132/79 (!) 142/89  ?Pulse: (!) 59 61  ?Resp: 13 18  ?Temp: 36.6 ?C (!) 35.9 ?C  ?SpO2: 95% 98%  ?  ?Last Pain:  ?Vitals:  ? 01/31/22 1151  ?TempSrc: Temporal  ?PainSc: 0-No pain  ? ? ?  ?  ?  ?  ?  ?  ? ?Precious Haws Broxton Broady ? ? ? ? ?

## 2022-01-31 NOTE — Anesthesia Procedure Notes (Signed)
Procedure Name: LMA Insertion ?Date/Time: 01/31/2022 10:30 AM ?Performed by: Hedda Slade, CRNA ?Pre-anesthesia Checklist: Patient identified, Patient being monitored, Timeout performed, Emergency Drugs available and Suction available ?Patient Re-evaluated:Patient Re-evaluated prior to induction ?Oxygen Delivery Method: Circle system utilized ?Preoxygenation: Pre-oxygenation with 100% oxygen ?Induction Type: IV induction ?Ventilation: Mask ventilation without difficulty ?LMA: LMA inserted ?LMA Size: 4.5 ?Tube type: Oral ?Number of attempts: 1 ?Placement Confirmation: positive ETCO2 and breath sounds checked- equal and bilateral ?Tube secured with: Tape ?Dental Injury: Teeth and Oropharynx as per pre-operative assessment  ? ? ? ? ?

## 2022-01-31 NOTE — Op Note (Signed)
Date of procedure: 01/31/22 ? 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 ?Postoperative diagnosis:  ?Same as above ? ?Procedure: ?Cystoscopy ?Dilation of membranous/prostatic urethral stricture ? ?Surgeon: Hollice Espy, MD ? ?Anesthesia: General ? ?Complications: None ? ?Intraoperative findings: False pass identified both membranous and prostatic urethra, able to dilate with scope over a wire.  Small calcifications identified within the prostatic urethra and false pass which were pushed up into the bladder and evacuated through the scope.  30 [place] OPTi lume balloon used across stricture for 5 minutes.  Bladder otherwise unremarkable, no evidence of recurrence cystoscopically. ? ?EBL: Minimal ? ?Specimens: None ? ?Drains: 24 French 30 cc balloon catheter converted to council tip over a wire. ? ?Indication: Nathan Holland is a 66 y.o. patient with recurrent prostatic and membranous bulbar urethral stricture unable to receive BCG as he had stopped self cathing event to have recurrent stricture earlier this week but not in retention as on previous occasions.  After reviewing the management options for treatment, he elected to proceed with the above surgical procedure(s). We have discussed the potential benefits and risks of the procedure, side effects of the proposed treatment, the likelihood of the patient achieving the goals of the procedure, and any potential problems that might occur during the procedure or recuperation. Informed consent has been obtained. ? ?Description of procedure: ? ?The patient was taken to the operating room and general anesthesia was induced.  The patient was placed in the dorsal lithotomy position, prepped and draped in the usual sterile fashion, and preoperative antibiotics were administered. A preoperative time-out was performed.  ? ?A 21 [place] scope was advanced per urethra towards  the prostatic fossa.  At the level of the members unit urethra, there is a strictured area at which time I advanced a wire but this did not go easily.  His advance the scope little bit further and identified a fairly large mucosalized false pass posteriorly with no evidence of a true lumen.  I backed the scope back up and with a more anterior approach, was able to advance a sensor wire through a very narrow prostatic urethra into the bladder.  I did encounter a few small calcifications both within the false pass as well as within the prostatic urethra which were pushed up into the bladder at the time of cystoscopy.  I was able to advance the scope over the wire into the bladder with some mild pressure.  The bladder was carefully inspected and noted to be fairly unremarkable, mild trabeculation and a stellate scar but no obvious evidence of recurrent disease.  Bilateral UOs were identified.  The small calcifications were evacuated from the bladder through the scope.  I then replaced the wire and remove the scope.  An option lumen balloon was advanced all the way up to the bladder neck, 5 cm balloon was used in order to traverse the entirety of the prostatic fossa and membranous urethra.  I inflated the balloon to 10 cm of water, 30 French balloon.  Allowed this to sit in place for a total of 5 minutes.  At the end of 5 minutes, I remove the balloon.  There was some mild prostatic/urethral bleeding but not significant.  I then converted a 24 [place] regular catheter to a council to be using an Electrical engineer and was able to advance this into the bladder over the wire without difficulty.  The balloon was filled with 30 cc  of sterile water.  The bladder was drained.  The patient was then cleaned and dried, repositioned to supine position, reversed from anesthesia, and taken to the PACU in stable condition. ? ?Plan: At like to leave this catheter for total of a week given the presence of a large false pass to allow for more  additional time for urethral healing.  When the catheter is removed, and like him to demonstrate self cath again on that day to ensure that he is using proper technique and he may require a coud? tip to avoid entrance into the false pass. ? ?We will put off his BCG doses for a month to allow for urethral healing and also defer surveillance cystoscopy given that this cystoscopy was unremarkable for at least 3 months. ? ?Hollice Espy, M.D. ? ? ?

## 2022-01-31 NOTE — Discharge Instructions (Addendum)

## 2022-01-31 NOTE — Anesthesia Preprocedure Evaluation (Signed)
Anesthesia Evaluation  ?Patient identified by MRN, date of birth, ID band ?Patient awake ? ? ? ?Reviewed: ?Allergy & Precautions, NPO status , Patient's Chart, lab work & pertinent test results ? ?History of Anesthesia Complications ?Negative for: history of anesthetic complications ? ?Airway ?Mallampati: III ? ?TM Distance: <3 FB ?Neck ROM: full ? ? ? Dental ? ?(+) Chipped, Poor Dentition, Missing ?  ?Pulmonary ?neg shortness of breath, former smoker,  ?  ?Pulmonary exam normal ? ? ? ? ? ? ? Cardiovascular ?Exercise Tolerance: Good ?(-) anginaNormal cardiovascular exam ? ? ?  ?Neuro/Psych ?negative neurological ROS ? negative psych ROS  ? GI/Hepatic ?Neg liver ROS, GERD  Controlled,  ?Endo/Other  ?negative endocrine ROS ? Renal/GU ?  ? ?  ?Musculoskeletal ? ?(+) Arthritis ,  ? Abdominal ?  ?Peds ? Hematology ?negative hematology ROS ?(+)   ?Anesthesia Other Findings ?Past Medical History: ?No date: Arthritis ?2021: Bladder cancer ([place]) ?    Comment:  TURBT with Gemcitabine ?No date: GERD (gastroesophageal reflux disease) ?    Comment:  occ ?3536,1443: History of kidney stones ?    Comment:  previous lithotripsies ?2012: Prostate cancer ([place]) ?    Comment:  prostate with seed placement ? ?Past Surgical History: ?12/25/2020: CYSTOSCOPY W/ RETROGRADES; Bilateral ?    Comment:  Procedure: CYSTOSCOPY WITH RETROGRADE PYELOGRAM;   ?             Surgeon: Hollice Espy, MD;  Location: [facility];   ?             Service: Urology;  Laterality: Bilateral; ?12/25/2020: CYSTOSCOPY WITH BIOPSY; N/A ?    Comment:  Procedure: CYSTOSCOPY WITH URETHRAL BIOPSY AND POSSIBLE  ?             BLADDER BIOPSY;  Surgeon: Hollice Espy, MD;  Location: ?             [facility];  Service: Urology;  Laterality: N/A; ?03/31/2018: ETHMOIDECTOMY; Bilateral ?    Comment:  Procedure: ETHMOIDECTOMY  WITH FRONTAL EXPLORATION;   ?             Surgeon: Clyde Canterbury, MD;  Location: [facility]  ?             [facility];   Service: ENT;  Laterality: Bilateral; ?2011,2012: EXTRACORPOREAL SHOCK WAVE LITHOTRIPSY ?01/02/2017: EXTRACORPOREAL SHOCK WAVE LITHOTRIPSY; Left ?    Comment:  Procedure: EXTRACORPOREAL SHOCK WAVE LITHOTRIPSY (ESWL); ?             Surgeon: Royston Cowper, MD;  Location: [facility];   ?             Service: Urology;  Laterality: Left; ?03/31/2018: IMAGE GUIDED SINUS SURGERY; Bilateral ?    Comment:  Procedure: IMAGE GUIDED SINUS SURGERY;  Surgeon:  ?             Clyde Canterbury, MD;  Location: [facility];   ?             Service: ENT;  Laterality: Bilateral;  NEED STRYKER  ?             DISK ?GAVE STRYKER DISK TO CECE 6-11  KP ?2012: INSERTION PROSTATE RADIATION SEED ?03/31/2018: MAXILLARY ANTROSTOMY; Bilateral ?    Comment:  Procedure: MAXILLARY ANTROSTOMY WITH TISSUE REMOVAL;   ?             Surgeon: Clyde Canterbury, MD;  Location: [facility]  ?  [facility];  Service: ENT;  Laterality: Bilateral; ?02/24/2020: TRANSURETHRAL RESECTION OF BLADDER TUMOR WITH MITOMYCIN-C;  ?N/A ?    Comment:  Procedure: TRANSURETHRAL RESECTION OF BLADDER TUMOR WITH ?             Gemcitabine;  Surgeon: Hollice Espy, MD;  Location:  ?             [facility];  Service: Urology;  Laterality: N/A; ?03/23/2020: TRANSURETHRAL RESECTION OF BLADDER TUMOR WITH MITOMYCIN-C;  ?N/A ?    Comment:  Procedure: TRANSURETHRAL RESECTION OF BLADDER TUMOR WITH ?             Gemcitabine;  Surgeon: Hollice Espy, MD;  Location:  ?             [facility];  Service: Urology;  Laterality: N/A; ? ?BMI   ? Body Mass Index: 34.87 kg/m?  ?  ? ? Reproductive/Obstetrics ?negative OB ROS ? ?  ? ? ? ? ? ? ? ? ? ? ? ? ? ?  ?  ? ? ? ? ? ? ? ? ?Anesthesia Physical ?Anesthesia Plan ? ?ASA: 3 ? ?Anesthesia Plan: General LMA  ? ?Post-op Pain Management:   ? ?Induction: Intravenous ? ?PONV Risk Score and Plan: Dexamethasone, Ondansetron, Midazolam and Treatment may vary due to age or medical condition ? ?Airway Management Planned: LMA ? ?Additional Equipment:   ? ?Intra-op Plan:  ? ?Post-operative Plan: Extubation in OR ? ?Informed Consent: I have reviewed the patients History and Physical, chart, labs and discussed the procedure including the risks, benefits and alternatives for the proposed anesthesia with the patient or authorized representative who has indicated his/her understanding and acceptance.  ? ? ? ?Dental Advisory Given ? ?Plan Discussed with: Anesthesiologist, CRNA and Surgeon ? ?Anesthesia Plan Comments: (Patient consented for risks of anesthesia including but not limited to:  ?- adverse reactions to medications ?- damage to eyes, teeth, lips or other oral mucosa ?- nerve damage due to positioning  ?- sore throat or hoarseness ?- Damage to heart, brain, nerves, lungs, other parts of body or loss of life ? ?Patient voiced understanding.)  ? ? ? ? ? ? ?Anesthesia Quick Evaluation ? ?

## 2022-01-31 NOTE — Interval H&P Note (Signed)
History and Physical Interval Note: ? ?01/31/2022 ?10:06 AM ? ?Nathan Holland  has presented today for surgery, with the diagnosis of Urethral Stricture.  The various methods of treatment have been discussed with the patient and family. After consideration of risks, benefits and other options for treatment, the patient has consented to  Procedure(s): ?CYSTOSCOPY WITH URETHRAL DILATATION (N/A) as a surgical intervention.  The patient's history has been reviewed, patient examined, no change in status, stable for surgery.  I have reviewed the patient's chart and labs.  Questions were answered to the patient's satisfaction.   ? ?RRR ?CTAB ? ? ?Hollice Espy ? ? ?

## 2022-01-31 NOTE — Transfer of Care (Signed)
Immediate Anesthesia Transfer of Care Note ? ?Patient: Nathan Holland ? ?Procedure(s) Performed: CYSTOSCOPY WITH URETHRAL DILATATION ? ?Patient Location: PACU ? ?Anesthesia Type:General ? ?Level of Consciousness: sedated ? ?Airway & Oxygen Therapy: Patient Spontanous Breathing and Patient connected to face mask oxygen ? ?Post-op Assessment: Report given to RN and Post -op Vital signs reviewed and stable ? ?Post vital signs: Reviewed and stable ? ?Last Vitals:  ?Vitals Value Taken Time  ?BP 111/70 01/31/22 1100  ?Temp 97.60f  ?Pulse 68 01/31/22 1101  ?Resp 10 01/31/22 1101  ?SpO2 97 % 01/31/22 1101  ?Vitals shown include unvalidated device data. ? ?Last Pain:  ?Vitals:  ? 01/31/22 0926  ?TempSrc: Oral  ?PainSc: 0-No pain  ?   ? ?  ? ?Complications: No notable events documented. ?

## 2022-02-01 ENCOUNTER — Encounter: Payer: Self-pay | Admitting: Urology

## 2022-02-01 LAB — CULTURE, URINE COMPREHENSIVE

## 2022-02-04 ENCOUNTER — Ambulatory Visit (INDEPENDENT_AMBULATORY_CARE_PROVIDER_SITE_OTHER): Payer: Medicare HMO | Admitting: Physician Assistant

## 2022-02-04 DIAGNOSIS — N3289 Other specified disorders of bladder: Secondary | ICD-10-CM

## 2022-02-04 MED ORDER — MIRABEGRON ER 50 MG PO TB24: 50.0000 mg | ORAL_TABLET | Freq: Every day | ORAL | 0 refills | Status: DC

## 2022-02-04 NOTE — Progress Notes (Signed)
Patient presents in office with complaints of his catheter bag not staying in place. After having pt undress it appears that patient's stat lock has been completely removed and leg straps are tied at the bottom of pt's calf. New stat lock placed on pt's LT thigh, new leg bag and straps placed on pt's leg in correct location. Patient also c/o bladder spasms, he describes the spasms as painful cramps that result in urine coming around catheter via the penis. Per verbal orders from [facility], [place] pt given Myrbetriq '50mg'$  samples x 7 days. Lot #[id number], Exp: 01/2024.  ?

## 2022-02-05 ENCOUNTER — Ambulatory Visit: Payer: Medicare HMO | Admitting: Physician Assistant

## 2022-02-07 ENCOUNTER — Ambulatory Visit (INDEPENDENT_AMBULATORY_CARE_PROVIDER_SITE_OTHER): Payer: Medicare HMO | Admitting: Physician Assistant

## 2022-02-07 VITALS — BP 147/110 | HR 80

## 2022-02-07 DIAGNOSIS — N35919 Unspecified urethral stricture, male, unspecified site: Secondary | ICD-10-CM | POA: Diagnosis not present

## 2022-02-07 NOTE — Progress Notes (Signed)
Catheter Removal ? ?Patient is present today for a catheter removal.  56m of water was drained from the balloon. A 24FR foley cath was removed from the bladder no complications were noted . Patient tolerated well. ? ?Performed by: SDebroah Loop PA-C ? ?Additional notes: We attempted CIC in clinic today. He was unable to advance a 1Sports administrator I'm concerned the catheter tip was getting trapped in his false passage. He reports he would force the catheter if he met resistance previously.  ? ?We discussed never forcing the catheter and to pull back the catheter, reposition the penis, and readvance the catheter if he ever meets resistance. He was ultimately able to pass a 127HScoude silicone Foley catheter to the level of the bladder without difficulty. I provided him with 3 of these catheters today, counseled him to wash them thoroughly with soap and water between uses, and use a water-based lubricant to ease insertion. We discussed CIC one time daily to keep the urethra patent. He expressed understanding. ? ?Follow up: Return in about 4 weeks (around 03/07/2022) for BCG maintenance + 4 months cystoscopy with Dr. BErlene Quan   ? ?I spent 12 minutes on the day of the encounter to include pre-visit record review, face-to-face time with the patient, and post-visit ordering of tests.  ?

## 2022-02-07 NOTE — Patient Instructions (Signed)

## 2022-02-12 ENCOUNTER — Ambulatory Visit (INDEPENDENT_AMBULATORY_CARE_PROVIDER_SITE_OTHER): Payer: Medicare HMO | Admitting: Urology

## 2022-02-12 ENCOUNTER — Ambulatory Visit: Payer: Medicare HMO | Admitting: Physician Assistant

## 2022-02-12 ENCOUNTER — Telehealth: Payer: Self-pay

## 2022-02-12 ENCOUNTER — Encounter: Payer: Self-pay | Admitting: Physician Assistant

## 2022-02-12 VITALS — BP 156/99 | HR 89 | Ht 71.0 in | Wt 271.0 lb

## 2022-02-12 DIAGNOSIS — R339 Retention of urine, unspecified: Secondary | ICD-10-CM | POA: Diagnosis not present

## 2022-02-12 DIAGNOSIS — Z8551 Personal history of malignant neoplasm of bladder: Secondary | ICD-10-CM | POA: Diagnosis not present

## 2022-02-12 LAB — BLADDER SCAN AMB NON-IMAGING: Scan Result: 71

## 2022-02-12 MED ORDER — CEPHALEXIN 250 MG PO CAPS
500.0000 mg | ORAL_CAPSULE | Freq: Once | ORAL | Status: AC
  Administered 2022-02-12: 500 mg via ORAL

## 2022-02-12 NOTE — Telephone Encounter (Signed)
Pt scheduled for 3 mBCG appts. Pt confirmed.  ?

## 2022-02-12 NOTE — Telephone Encounter (Signed)
-----   Message from Gordy Clement, [place] sent at 02/07/2022 10:18 AM EDT ----- ?Sam would like for this pt to be set up for BCG in a month, let me know if I need to do anything. Thanks! ? ?-OG ? ? ? ?

## 2022-02-12 NOTE — Progress Notes (Signed)
? ?  02/12/22 ? ? ?HPI: 66 year old male with a personal history of urethral stricture, prostatic urethral false pass bladder cancer who presents today complaining that he is unable to pass his coud? tip self cath as of yesterday. ? ?Today in the office, we attempted several size and shape catheters including 116/ 18 [place] coud?, 14 French straight, etc. without success. ? ?Blood pressure (!) 156/99, pulse 89, height '5\' 11"'$  (1.803 m), weight 271 lb (122.9 kg). ?NED. A&Ox3.   ?No respiratory distress   ?Abd soft, NT, ND ?Normal phallus with bilateral descended testicles ? ?Cystoscopy Procedure Note ? ?Patient identification was confirmed, informed consent was obtained, and patient was prepped using Betadine solution.  Lidocaine jelly was administered per urethral meatus.   ? ? ?Pre-Procedure: ?- Inspection reveals a normal caliber ureteral meatus. ? ?Procedure: ?-Flexible was advanced to the level of the prostatic apex where a same/similar false pass was seen as noted intraoperatively posteriorly.  Anteriorly, there is a very small lumen.  This was at a nearly 90 degree angle.  Under direct visualization, I advanced a sensor wire which went easily.  I then was able to navigate the scope into the bladder without difficulty.  The bladder was unremarkable.  I then remove the scope leaving the wire in place.  An 22 [place] council tip advanced easily without the need for dilation over this wire.  The balloon was filled with 10 cc of sterile water.  Clear yellow urine and irrigation drained.  Procedure was well-tolerated. ? ?He did receive a periprocedural dose of Keflex today. ? ? ?Post-Procedure: ?- Patient tolerated the procedure well ? ?Assessment/ Plan: ? ?1.  History of bladder cancer-BCG has been deferred in light of difficult catheterizations urethral strictures, etc.  At this point, I recommended leaving the catheter for at least 3 to 4 weeks to allow this difficult false pass to heal as well as keep his urethra  patent.  During this time, we can go ahead and initiate BCG by clamping his catheter.  He is agreeable with this plan. ? ?2.  Prostatic urethral trauma- ?Status post cystoscopically placed Foley today due to large posterior prostatic urethral false pass ? ? ? ? ?Hollice Espy, MD ? ?

## 2022-02-19 ENCOUNTER — Ambulatory Visit (INDEPENDENT_AMBULATORY_CARE_PROVIDER_SITE_OTHER): Payer: Medicare HMO | Admitting: Physician Assistant

## 2022-02-19 ENCOUNTER — Encounter: Payer: Self-pay | Admitting: Physician Assistant

## 2022-02-19 VITALS — BP 135/75 | HR 90 | Ht 71.0 in | Wt 271.0 lb

## 2022-02-19 DIAGNOSIS — R339 Retention of urine, unspecified: Secondary | ICD-10-CM

## 2022-02-19 DIAGNOSIS — N3289 Other specified disorders of bladder: Secondary | ICD-10-CM

## 2022-02-19 DIAGNOSIS — C679 Malignant neoplasm of bladder, unspecified: Secondary | ICD-10-CM

## 2022-02-19 DIAGNOSIS — T839XXA Unspecified complication of genitourinary prosthetic device, implant and graft, initial encounter: Secondary | ICD-10-CM | POA: Diagnosis not present

## 2022-02-19 MED ORDER — CEFTRIAXONE SODIUM 1 G IJ SOLR
1.0000 g | Freq: Once | INTRAMUSCULAR | Status: AC
  Administered 2022-02-19: 1 g via INTRAMUSCULAR

## 2022-02-19 MED ORDER — RENACIDIN IR SOLN
30.0000 mL | Freq: Once | Status: AC
  Administered 2022-02-26: 30 mL

## 2022-02-19 MED ORDER — BCG LIVE 50 MG IS SUSR
3.2400 mL | Freq: Once | INTRAVESICAL | Status: AC
  Administered 2022-02-19: 81 mg via INTRAVESICAL

## 2022-02-19 MED ORDER — GEMTESA 75 MG PO TABS: 75.0000 mg | ORAL_TABLET | Freq: Every day | ORAL | 0 refills | Status: DC

## 2022-02-19 NOTE — Progress Notes (Signed)
Patient presented to clinic today for scheduled maintenance BCG No. 1 of 3.  Unfortunately, his catheter was significantly encrusted today and I was initially unable to instill the BCG through the catheter.  Instead, I instilled 1 dose of Renacidin through the Foley catheter in place in an attempt to dissolve some of his urinary encrustation.  The catheter was plugged and the solution was allowed to dwell for 15 minutes.  After 15 minutes, the catheter was drained, but unfortunately no significant improvement was made in his encrustation. ? ?At this point, I asked Dr. Erlene Quan to perform a Foley catheter exchange over guidewire in clinic today.  Patient was prepped in a sterile fashion and the Foley catheter was exchanged over a sensor wire.  Successful placement of an 61 French council tip Foley catheter was confirmed with 60 cc of bladder irrigation.  New catheter irrigated easily.  The Foley balloon was inflated with 10 cc of sterile water. ? ?At this point, we proceeded with scheduled BCG instillation as below. ? ?After instillation, he received 1 dose of IM Rocephin for UTI prevention in the setting of catheter exchange.  I provided him with 6 doses of Renacidin for home use and counseled him to use this once daily starting tomorrow in an attempt to keep his new catheter patent.  I counseled him to stay well-hydrated with water or lemonade in the coming days.  He expressed understanding. ? ?He reports continued, but slightly improved bladder spasms on Myrbetriq 50 mg daily.  I gave him samples of Gemtesa as an alternative today. ? ?BCG Bladder Instillation ? ?BCG # 1 of 3 ? ?Due to Bladder Cancer patient is present today for a BCG treatment. Patient was cleaned and prepped in a sterile fashion with betadine. 43m of reconstituted BCG was instilled into the bladder through the Foley catheter in place. The catheter was then plugged. Patient tolerated well, no complications were noted ? ?Performed by: SDebroah Loop PA-C and DEdwin Dada CC[place]? ?Follow up/ Additional notes: 1 week for BCG #2 of 3 ?  ?

## 2022-02-19 NOTE — Patient Instructions (Signed)
Your Timeline for Today: ? ?Right now through 7pm: Keep your catheter plugged and do your quarter turns every 15 minutes. ?7pm-1am: Remove your catheter plug and drain your bladder into the toilet. Every time you urinate, pour 1/2 cup of bleach into the toilet and let it sit for 15 minutes prior to flushing. ?1am onward: Resume your normal routine. ? ?Every Day This Week, Starting Tomorrow: ?Push one vial of the clear Renacidin medicine into your catheter and then plug your catheter. Let it sit for 15 minutes, then unplug your catheter and let it drain freely. ? ?Before Your Next Treatment Next Week: ?Plug your catheter 1 hour before your treatment. ? ?

## 2022-02-20 LAB — URINALYSIS, COMPLETE
Bilirubin, UA: NEGATIVE
Glucose, UA: NEGATIVE
Nitrite, UA: POSITIVE — AB
Specific Gravity, UA: 1.025 (ref 1.005–1.030)
Urobilinogen, Ur: 1 mg/dL (ref 0.2–1.0)
pH, UA: 7 (ref 5.0–7.5)

## 2022-02-20 LAB — MICROSCOPIC EXAMINATION

## 2022-02-22 LAB — CULTURE, URINE COMPREHENSIVE

## 2022-02-26 ENCOUNTER — Encounter: Payer: Self-pay | Admitting: Urology

## 2022-02-26 ENCOUNTER — Ambulatory Visit: Payer: Medicare HMO | Admitting: Urology

## 2022-02-26 DIAGNOSIS — C679 Malignant neoplasm of bladder, unspecified: Secondary | ICD-10-CM | POA: Diagnosis not present

## 2022-02-26 LAB — URINALYSIS, COMPLETE
Bilirubin, UA: NEGATIVE
Glucose, UA: NEGATIVE
Ketones, UA: NEGATIVE
Nitrite, UA: NEGATIVE
Specific Gravity, UA: >1.03 — ABNORMAL HIGH (ref 1.005–1.030)
Urobilinogen, Ur: 0.2 mg/dL (ref 0.2–1.0)
pH, UA: 5.5 (ref 5.0–7.5)

## 2022-02-26 LAB — MICROSCOPIC EXAMINATION: WBC, UA: >30 /hpf — AB (ref 0–5)

## 2022-02-26 MED ORDER — BCG LIVE 50 MG IS SUSR
3.2400 mL | Freq: Once | INTRAVESICAL | Status: AC
  Administered 2022-02-26: 81 mg via INTRAVESICAL

## 2022-02-26 NOTE — Progress Notes (Incomplete)
    02/26/22 CC: No chief complaint on file.     HPI: Nathan Holland is a 66 y.o. male with a personal history of urethral stricture, prostatic urethral false pass bladder cancer who presents today for BCG.   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     He is s/p induction BCG and maintenance BCG in 04/2021.    Returned to the OR in 12/2020 for urethral dilation, bladder biopsies, bilateral retrograde pyelogram. All were consistent with benign etiologies.    He underwent a cystoscopy with urethral dilation on 11/16/2021.   Cystoscopy on 12/18/2021 showed a shaggy rugged area.   He returned to the OR on 01/31/2022 for cystoscopy with dilation of membranous/prostatic urethral stricture. Intraoperative findings: False pass identified both membranous and prostatic urethra, able to dilate with scope over a wire.  Small calcifications identified within the prostatic urethra and false pass which were pushed up into the bladder and evacuated through the scope.  30 [place] OPTi lume balloon.   He was seen in clinic by me on 02/12/2022 he was unable to pass his coude tip self cath. Several size and shape catheters were attempted without success.   There were no vitals filed for this visit. NED. A&Ox3.   No respiratory distress   Abd soft, NT, ND Normal phallus with bilateral descended testicles  Cystoscopy Procedure Note  Patient identification was confirmed, informed consent was obtained, and patient was prepped using Betadine solution.  Lidocaine jelly was administered per urethral meatus.     Pre-Procedure: - Inspection reveals a normal caliber ureteral meatus.  Procedure: The flexible cystoscope was introduced without difficulty - No urethral strictures/lesions are present. - {Blank multiple:19197::"Enlarged","Surgically absent","Normal"} prostate *** - {Blank multiple:19197::"Normal","Elevated","Tight"} bladder neck - Bilateral ureteral orifices  identified - Bladder mucosa  reveals no ulcers, tumors, or lesions - No bladder stones - No trabeculation  Retroflexion shows ***   Post-Procedure: - Patient tolerated the procedure well  BCG Bladder Instillation  BCG # ***  Due to Bladder Cancer patient is present today for a BCG treatment. Patient was cleaned and prepped in a sterile fashion with betadine. A ***FR catheter was inserted, urine return was noted ***ml, urine was *** in color.  32m of reconstituted BCG was instilled into the bladder. The catheter was then removed. Patient tolerated well, {dnt complications:20057}  Assessment/ Plan:   No follow-ups on file.  I,Kailey Littlejohn,acting as a sEducation administratorfor AHollice Espy MD.,have documented all relevant documentation on the behalf of AHollice Espy MD,as directed by  AHollice Espy MD while in the presence of AHollice Espy MD.

## 2022-02-26 NOTE — Progress Notes (Unsigned)
Patient ID: Nathan Holland, male   DOB: 03-Dec-1955, 66 y.o.   MRN: [id number] BCG Bladder Instillation  BCG # 2 of 3  Due to Bladder Cancer patient is present today for a BCG treatment. Patient present today with catheter plugged for urine collection. UA was collected and approval given per Dr. Erlene Quan to proceed with treatment.  Catheter was irrigated with 29m of saline to confirm that there was no occlusion. Irrigation noted small amount of mucus mostly clear no complications. Patient states that he has done his Renacidin doses this past week. 550mof reconstituted BCG was instilled into the bladder through existing catheter in place. A plug was placed into the catheter. Patient tolerated well, no complications were noted  Performed by: SaFonnie JarvisCMA  Follow up/ Additional notes: Patient was given six additional does of Renacidin to use over the next week. As well as another plug so that he will come with his catheter plugged to next weeks visit

## 2022-03-05 ENCOUNTER — Ambulatory Visit: Payer: Medicare HMO | Admitting: Physician Assistant

## 2022-03-05 DIAGNOSIS — D494 Neoplasm of unspecified behavior of bladder: Secondary | ICD-10-CM

## 2022-03-05 DIAGNOSIS — R339 Retention of urine, unspecified: Secondary | ICD-10-CM | POA: Diagnosis not present

## 2022-03-05 MED ORDER — BCG LIVE 50 MG IS SUSR
3.2400 mL | Freq: Once | INTRAVESICAL | Status: AC
  Administered 2022-03-05: 81 mg via INTRAVESICAL

## 2022-03-05 NOTE — Progress Notes (Signed)
BCG Bladder Instillation  BCG # 3 of 3  Due to Bladder Cancer patient is present today for a BCG treatment. Patient was cleaned and prepped in a sterile fashion with betadine. A 10YT silicone coude catheter was inserted, urine return was noted 61m, urine was yellow in color.  560mof reconstituted BCG was instilled into the bladder. The catheter was then removed. Patient tolerated well, no complications were noted  Performed by: SaDebroah LoopPA-C and SaFonnie JarvisCMA  Additional notes: Foley catheter fell out on Saturday with balloon intact, per patient report. He notes some gross hematuria and dysuria immediately thereafter, however both have resolved today.   Counseled patient to continue self-catheterization to keep the urethra patent.  Follow up/ Additional notes: 3 month cystoscopy with Dr. BrErlene Quan

## 2022-03-06 LAB — URINALYSIS, COMPLETE
Bilirubin, UA: NEGATIVE
Glucose, UA: NEGATIVE
Ketones, UA: NEGATIVE
Nitrite, UA: NEGATIVE
Specific Gravity, UA: 1.03 (ref 1.005–1.030)
Urobilinogen, Ur: 0.2 mg/dL (ref 0.2–1.0)
pH, UA: 5.5 (ref 5.0–7.5)

## 2022-03-06 LAB — MICROSCOPIC EXAMINATION: WBC, UA: >30 /hpf — AB (ref 0–5)

## 2022-03-09 LAB — CULTURE, URINE COMPREHENSIVE

## 2022-03-19 ENCOUNTER — Other Ambulatory Visit: Payer: Medicare HMO | Admitting: Urology

## 2022-05-21 ENCOUNTER — Other Ambulatory Visit: Payer: Medicare HMO | Admitting: Urology

## 2022-06-24 NOTE — Progress Notes (Unsigned)
06/25/22 CC:  Chief Complaint  Patient presents with   Cysto    HPI: Nathan Holland is a 66 y.o. male with a personal history of nonmuscle invasive bladder cancer high-grade T1/CIS, prostate cancer, nephrolithiasis, and urge incontinence, who presents today for a cystoscopy.   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     He is s/p induction BCG and maintenance BCG in 04/2021.    He most recently returned the the OR in 12/2020 for urethral dilation, bladder biopsies, bilateral retrograde pyelogram. All were consistent with benign etiologies.   He underwent a cystoscopy with urethral dilation on 11/16/2021.   His catheter was removed on 11/19/2021 by Zara Council, PA-c.   He was seen back in clinic on 12/20/4006 for a complicated catheter placement with Dr. Bernardo Heater.  Estimated volume by bladder scan was 77m. He was given Levaquin post dilation.   He returned to the operating room on 01/31/2022 for urethral dilation and cystoscopy.  Time, there is no evidence of disease.  After this procedure, he underwent CIC teaching and reports that he was only successful once here in the office but never at home and quit doing it.  At the time of scheduled BCG maintenance, were unable to pass the scope.  Ended up having to scope the catheter in.  We end up leaving the catheter in and he was treated with 3 maintenance doses of BCG.  He returns today for cystoscopy.  He reports that he has been actually voiding better than previously.    Vitals:   06/25/22 0836  BP: (!) 161/96  Pulse: 73   NED. A&Ox3.   No respiratory distress   Abd soft, NT, ND Normal phallus with bilateral descended testicles  Cystoscopy Procedure Note  Patient identification was confirmed, informed consent was obtained, and patient was prepped using Betadine solution.  Lidocaine jelly was administered per urethral meatus.     Pre-Procedure: - Inspection reveals a normal caliber  ureteral meatus.  Procedure: Shaggy rugged urethral stricture near the apex of the prostate as per previous.  The true lumen was able to be visualized and with careful navigation, I was ultimately able to advance the scope through his prostate into his bladder.  The bladder itself was mildly trabeculated with some very subtle texture on the anterior portion of the bladder but no overt erythema and no papillary changes.  Retroflexion shows a shaggy rugged area that was passable with 16 fr scope.    Post-Procedure: - Patient tolerated the procedure well  At the end of the procedure, I brought in a coud tip straight cath and using standard sterile technique, we attempted to let him pass the catheter.  He was unsuccessful in doing this.  He did end up having a large bladder spasm upon attempt with good urinary stream which is reassuring.   Assessment/ Plan:  1. Postprocedural male urethral stricture - Has required dilation times multiple for recurrent bulbar urethral stricture -Not interested in definitive treatment, would benefit from self catheter  -Attempted teaching again today, unsuccessful in doing so.  Ultimately this point, options are fairly limited for him.  Quickly, will be able to continue to do scope with surveillance cystoscopies in the office.  He is asymptomatic from a stricture currently.  2. Malignant neoplasm of urinary bladder, unspecified site (HCC) - We will continue surveillance cystoscopy acute 36-monthasis -Nonspecific subtle changes in the anterior bladder wall, will follow this closely.  Urine cytology today.  If  this area persists or progresses at this 23-monthfollow-up, we will consider repeat biopsy to rule out CIS  Cystoscopy in 3 months   AHollice Espy MD

## 2022-06-25 ENCOUNTER — Ambulatory Visit: Payer: Medicare HMO | Admitting: Urology

## 2022-06-25 VITALS — BP 161/96 | HR 73 | Ht 71.0 in | Wt 266.0 lb

## 2022-06-25 DIAGNOSIS — N99114 Postprocedural urethral stricture, male, unspecified: Secondary | ICD-10-CM | POA: Diagnosis not present

## 2022-06-25 DIAGNOSIS — D494 Neoplasm of unspecified behavior of bladder: Secondary | ICD-10-CM | POA: Diagnosis not present

## 2022-06-25 DIAGNOSIS — N3289 Other specified disorders of bladder: Secondary | ICD-10-CM | POA: Diagnosis not present

## 2022-06-25 DIAGNOSIS — Z8551 Personal history of malignant neoplasm of bladder: Secondary | ICD-10-CM | POA: Diagnosis not present

## 2022-06-25 DIAGNOSIS — R339 Retention of urine, unspecified: Secondary | ICD-10-CM | POA: Diagnosis not present

## 2022-06-25 LAB — URINALYSIS, COMPLETE
Bilirubin, UA: NEGATIVE
Glucose, UA: NEGATIVE
Ketones, UA: NEGATIVE
Leukocytes,UA: NEGATIVE
Nitrite, UA: NEGATIVE
Specific Gravity, UA: 1.02 (ref 1.005–1.030)
Urobilinogen, Ur: 0.2 mg/dL (ref 0.2–1.0)
pH, UA: 7 (ref 5.0–7.5)

## 2022-06-25 LAB — MICROSCOPIC EXAMINATION

## 2022-06-27 LAB — CYTOLOGY - NON PAP

## 2022-09-25 ENCOUNTER — Ambulatory Visit: Payer: Medicare HMO | Admitting: Urology

## 2022-09-25 VITALS — BP 158/94 | HR 75 | Ht 71.0 in

## 2022-09-25 DIAGNOSIS — R339 Retention of urine, unspecified: Secondary | ICD-10-CM

## 2022-09-25 DIAGNOSIS — D494 Neoplasm of unspecified behavior of bladder: Secondary | ICD-10-CM | POA: Diagnosis not present

## 2022-09-25 DIAGNOSIS — Z8551 Personal history of malignant neoplasm of bladder: Secondary | ICD-10-CM | POA: Diagnosis not present

## 2022-09-25 DIAGNOSIS — N99114 Postprocedural urethral stricture, male, unspecified: Secondary | ICD-10-CM

## 2022-09-25 LAB — MICROSCOPIC EXAMINATION

## 2022-09-25 LAB — URINALYSIS, COMPLETE
Bilirubin, UA: NEGATIVE
Glucose, UA: NEGATIVE
Ketones, UA: NEGATIVE
Leukocytes,UA: NEGATIVE
Nitrite, UA: NEGATIVE
RBC, UA: NEGATIVE
Specific Gravity, UA: 1.02 (ref 1.005–1.030)
Urobilinogen, Ur: 0.2 mg/dL (ref 0.2–1.0)
pH, UA: 6.5 (ref 5.0–7.5)

## 2022-09-25 NOTE — Progress Notes (Signed)
09/25/22 CC:  Chief Complaint  Patient presents with   Cysto    HPI: Nathan Holland is a 66 y.o. male with a personal history of nonmuscle invasive bladder cancer high-grade T1/CIS, prostate cancer, nephrolithiasis, and urge incontinence, who presents today for a cystoscopy.   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     He is s/p induction BCG and maintenance BCG in 04/2021.    He most recently returned the the OR in 12/2020 for urethral dilation, bladder biopsies, bilateral retrograde pyelogram. All were consistent with benign etiologies.   He underwent a cystoscopy with urethral dilation on 11/16/2021.   His catheter was removed on 11/19/2021 by Zara Council, PA-c.   He was seen back in clinic on 11/03/7865 for a complicated catheter placement with Dr. Bernardo Heater.  Estimated volume by bladder scan was 777m. He was given Levaquin post dilation.   He returned to the operating room on 01/31/2022 for urethral dilation and cystoscopy.  Time, there is no evidence of disease.  After this procedure, he underwent CIC teaching and reports that he was only successful once here in the office but never at home and quit doing it.  At the time of scheduled BCG maintenance, were unable to pass the scope.  Ended up having to scope the catheter in.  We end up leaving the catheter in and he was treated with 3 maintenance doses of BCG.  He returns today for cystoscopy.  He has no urinary complaints today and reports that his stream is good has been emptying well.    Vitals:   09/25/22 0834  BP: (!) 158/94  Pulse: 75   NED. A&Ox3.   No respiratory distress   Abd soft, NT, ND Normal phallus with bilateral descended testicles  Cystoscopy Procedure Note  Patient identification was confirmed, informed consent was obtained, and patient was prepped using Betadine solution.  Lidocaine jelly was administered per urethral meatus.     Pre-Procedure: - Inspection  reveals a normal caliber ureteral meatus.  Procedure: Shaggy rugged urethral stricture near the apex of the prostate as per previous.  The true lumen was able to be visualized and with careful navigation, I was ultimately able to advance the scope through his prostate into his bladder.  The bladder itself was mildly trabeculated with 1 small area on the right lateral bladder wall which had a very subtly raised portion was slightly erythematous, query whether this was related to scope trauma versus early CIS.  Retroflexion unremarkable   Post-Procedure: - Patient tolerated the procedure well  At the end of the procedure, I brought in a coud tip straight cath and using standard sterile technique, we attempted to let him pass the catheter.  He was unsuccessful in doing this.  He did end up having a large bladder spasm upon attempt with good urinary stream which is reassuring.   Assessment/ Plan:  1. 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 Malignant ne[place] of urinary bladder, unspecified site [place] - We will continue surveillance cystoscopy acute 350-monthasis with close attention to the area of erythema -Waxing and waning nonspecific areas of erythema, previous area not appreciated today but there is a new area on the left lateral bladder wall -Close attention follow-up at this 3-38-monthterval to this area -Unable to provide enough urine for cytology today, will  repeat at next visit  Cystoscopy in 3 months   Hollice Espy, MD

## 2022-10-23 DIAGNOSIS — R053 Chronic cough: Secondary | ICD-10-CM | POA: Diagnosis not present

## 2022-10-23 DIAGNOSIS — R059 Cough, unspecified: Secondary | ICD-10-CM | POA: Diagnosis not present

## 2022-10-23 DIAGNOSIS — Z125 Encounter for screening for malignant neoplasm of prostate: Secondary | ICD-10-CM | POA: Diagnosis not present

## 2022-10-23 DIAGNOSIS — I10 Essential (primary) hypertension: Secondary | ICD-10-CM | POA: Diagnosis not present

## 2022-10-23 DIAGNOSIS — R7303 Prediabetes: Secondary | ICD-10-CM | POA: Diagnosis not present

## 2022-10-23 DIAGNOSIS — Z1211 Encounter for screening for malignant neoplasm of colon: Secondary | ICD-10-CM | POA: Diagnosis not present

## 2022-10-23 DIAGNOSIS — Z0000 Encounter for general adult medical examination without abnormal findings: Secondary | ICD-10-CM | POA: Diagnosis not present

## 2022-10-23 DIAGNOSIS — R062 Wheezing: Secondary | ICD-10-CM | POA: Diagnosis not present

## 2022-10-23 DIAGNOSIS — C679 Malignant neoplasm of bladder, unspecified: Secondary | ICD-10-CM | POA: Diagnosis not present

## 2022-11-07 DIAGNOSIS — R062 Wheezing: Secondary | ICD-10-CM | POA: Diagnosis not present

## 2022-11-07 DIAGNOSIS — Z8546 Personal history of malignant neoplasm of prostate: Secondary | ICD-10-CM | POA: Diagnosis not present

## 2022-11-07 DIAGNOSIS — R7303 Prediabetes: Secondary | ICD-10-CM | POA: Diagnosis not present

## 2022-11-07 DIAGNOSIS — I10 Essential (primary) hypertension: Secondary | ICD-10-CM | POA: Diagnosis not present

## 2022-11-07 DIAGNOSIS — Z0000 Encounter for general adult medical examination without abnormal findings: Secondary | ICD-10-CM | POA: Diagnosis not present

## 2022-12-03 DIAGNOSIS — I10 Essential (primary) hypertension: Secondary | ICD-10-CM | POA: Diagnosis not present

## 2022-12-03 DIAGNOSIS — M791 Myalgia, unspecified site: Secondary | ICD-10-CM | POA: Diagnosis not present

## 2022-12-16 DIAGNOSIS — R7303 Prediabetes: Secondary | ICD-10-CM | POA: Diagnosis not present

## 2022-12-16 DIAGNOSIS — J45909 Unspecified asthma, uncomplicated: Secondary | ICD-10-CM | POA: Diagnosis not present

## 2022-12-16 DIAGNOSIS — I10 Essential (primary) hypertension: Secondary | ICD-10-CM | POA: Diagnosis not present

## 2022-12-24 ENCOUNTER — Ambulatory Visit: Payer: Medicare HMO | Admitting: Urology

## 2022-12-24 ENCOUNTER — Encounter: Payer: Self-pay | Admitting: Urology

## 2022-12-24 ENCOUNTER — Other Ambulatory Visit: Payer: Medicare HMO | Admitting: Urology

## 2022-12-24 VITALS — BP 149/85 | HR 84 | Ht 72.0 in | Wt 252.0 lb

## 2022-12-24 DIAGNOSIS — Z8551 Personal history of malignant neoplasm of bladder: Secondary | ICD-10-CM | POA: Diagnosis not present

## 2022-12-24 DIAGNOSIS — N99111 Postprocedural bulbous urethral stricture: Secondary | ICD-10-CM | POA: Diagnosis not present

## 2022-12-24 DIAGNOSIS — D494 Neoplasm of unspecified behavior of bladder: Secondary | ICD-10-CM

## 2022-12-25 LAB — MICROSCOPIC EXAMINATION: Bacteria, UA: NONE SEEN

## 2022-12-25 LAB — URINALYSIS, COMPLETE
Bilirubin, UA: NEGATIVE
Glucose, UA: NEGATIVE
Ketones, UA: NEGATIVE
Leukocytes,UA: NEGATIVE
Nitrite, UA: NEGATIVE
Protein,UA: NEGATIVE
RBC, UA: NEGATIVE
Specific Gravity, UA: <1.005 — ABNORMAL LOW (ref 1.005–1.030)
Urobilinogen, Ur: 0.2 mg/dL (ref 0.2–1.0)
pH, UA: 5.5 (ref 5.0–7.5)

## 2022-12-25 NOTE — Progress Notes (Signed)
   12/24/22  CC:  Chief Complaint  Patient presents with   Cysto    HPI: Nathan Holland is a 67 y.o. male with a personal history of nonmuscle invasive bladder cancer high-grade T1/CIS, prostate cancer, nephrolithiasis, and urge incontinence, who presents today for a cystoscopy.   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     He is s/p induction BCG and maintenance BCG in 04/2021.    He most recently returned the the OR in 12/2020 for urethral dilation, bladder biopsies, bilateral retrograde pyelogram. All were consistent with benign etiologies.   He underwent a cystoscopy with urethral dilation on 11/16/2021.   His catheter was removed on 11/19/2021 by Zara Council, PA-c.   He was seen back in clinic on [id number] for a complicated catheter placement with Dr. Bernardo Heater.  Estimated volume by bladder scan was 754mL. He was given Levaquin post dilation.   He returned to the operating room on 01/31/2022 for urethral dilation and cystoscopy.  Time, there is no evidence of disease.  After this procedure, he underwent CIC teaching and reports that he was only successful once here in the office but never at home and quit doing it.  At the time of scheduled BCG maintenance, were unable to pass the scope.  Ended up having to scope the catheter in.  We end up leaving the catheter in and he was treated with 3 maintenance doses of BCG.  He returns today for cystoscopy.  He has no urinary complaints today and reports that his stream is good has been emptying well.    Vitals:   12/24/22 1511  BP: (!) 149/85  Pulse: 84   NED. A&Ox3.   No respiratory distress   Abd soft, NT, ND Normal phallus with bilateral descended testicles  Cystoscopy Procedure Note  Patient identification was confirmed, informed consent was obtained, and patient was prepped using Betadine solution.  Lidocaine jelly was administered per urethral meatus.     Pre-Procedure: - Inspection  reveals a normal caliber ureteral meatus.  Procedure: Shaggy rugged urethral stricture near the apex of the prostate as per previous.  The true lumen was able to be visualized and with careful navigation, I was ultimately able to advance the scope through his prostate into his bladder.  The bladder itself was mildly trabeculated without ulcerations tumors or lesions.  Retroflexion unremarkable   Post-Procedure: - Patient tolerated the procedure well   Assessment/ Plan:  1. 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 Malignant neoplasm of urinary bladder, unspecified site [facility]) -NED today -Urine cytology today -Given his history of waxing/ waning areas of erythema, will continue to follow him on a q. 26-month basis but consider extending interval to every 6 months on next cystoscopy if it is stable -Also hesitate to increase interval given difficulty advancing scope in the past we will further this discussion at next procedure  Cystoscopy in 3 months   Hollice Espy, MD

## 2022-12-26 LAB — CYTOLOGY - NON PAP

## 2023-03-15 DIAGNOSIS — F1021 Alcohol dependence, in remission: Secondary | ICD-10-CM | POA: Diagnosis not present

## 2023-03-15 DIAGNOSIS — M1990 Unspecified osteoarthritis, unspecified site: Secondary | ICD-10-CM | POA: Diagnosis not present

## 2023-03-15 DIAGNOSIS — Z8551 Personal history of malignant neoplasm of bladder: Secondary | ICD-10-CM | POA: Diagnosis not present

## 2023-03-15 DIAGNOSIS — I10 Essential (primary) hypertension: Secondary | ICD-10-CM | POA: Diagnosis not present

## 2023-03-15 DIAGNOSIS — J449 Chronic obstructive pulmonary disease, unspecified: Secondary | ICD-10-CM | POA: Diagnosis not present

## 2023-03-15 DIAGNOSIS — Z87891 Personal history of nicotine dependence: Secondary | ICD-10-CM | POA: Diagnosis not present

## 2023-03-15 DIAGNOSIS — Z8546 Personal history of malignant neoplasm of prostate: Secondary | ICD-10-CM | POA: Diagnosis not present

## 2023-03-15 DIAGNOSIS — Z6834 Body mass index (BMI) 34.0-34.9, adult: Secondary | ICD-10-CM | POA: Diagnosis not present

## 2023-03-15 DIAGNOSIS — E669 Obesity, unspecified: Secondary | ICD-10-CM | POA: Diagnosis not present

## 2023-03-26 ENCOUNTER — Telehealth: Payer: Self-pay | Admitting: Urology

## 2023-03-26 ENCOUNTER — Other Ambulatory Visit: Payer: Medicare HMO | Admitting: Urology

## 2023-03-26 NOTE — Telephone Encounter (Signed)
Can you please follow-up with Mr. Nathan Holland, he was a no-show today for cystoscopy and get him rescheduled.  He is usually very good about coming in for his surveillance cystoscopy procedures.  Vanna Scotland, MD

## 2023-03-26 NOTE — Telephone Encounter (Signed)
Spoke with patient, he did not get reminder about this appointment and forgot. Appointment rescheduled.

## 2023-04-24 DIAGNOSIS — Z86010 Personal history of colonic polyps: Secondary | ICD-10-CM | POA: Diagnosis not present

## 2023-04-24 DIAGNOSIS — K5790 Diverticulosis of intestine, part unspecified, without perforation or abscess without bleeding: Secondary | ICD-10-CM | POA: Diagnosis not present

## 2023-05-07 ENCOUNTER — Ambulatory Visit: Payer: Medicare HMO | Admitting: Urology

## 2023-05-07 VITALS — BP 149/73 | HR 86 | Ht 72.0 in | Wt 239.4 lb

## 2023-05-07 DIAGNOSIS — N5203 Combined arterial insufficiency and corporo-venous occlusive erectile dysfunction: Secondary | ICD-10-CM | POA: Diagnosis not present

## 2023-05-07 DIAGNOSIS — R8290 Unspecified abnormal findings in urine: Secondary | ICD-10-CM | POA: Diagnosis not present

## 2023-05-07 DIAGNOSIS — N99114 Postprocedural urethral stricture, male, unspecified: Secondary | ICD-10-CM

## 2023-05-07 DIAGNOSIS — Z8551 Personal history of malignant neoplasm of bladder: Secondary | ICD-10-CM

## 2023-05-07 DIAGNOSIS — D494 Neoplasm of unspecified behavior of bladder: Secondary | ICD-10-CM | POA: Diagnosis not present

## 2023-05-07 LAB — URINALYSIS, COMPLETE
Bilirubin, UA: NEGATIVE
Glucose, UA: NEGATIVE
Ketones, UA: NEGATIVE
Nitrite, UA: NEGATIVE
Specific Gravity, UA: 1.02 (ref 1.005–1.030)
Urobilinogen, Ur: 1 mg/dL (ref 0.2–1.0)
pH, UA: 7 (ref 5.0–7.5)

## 2023-05-07 LAB — MICROSCOPIC EXAMINATION: WBC, UA: >30 /hpf — AB (ref 0–5)

## 2023-05-07 MED ORDER — SILDENAFIL CITRATE 20 MG PO TABS: ORAL_TABLET | ORAL | 11 refills | Status: DC

## 2023-05-07 NOTE — Progress Notes (Signed)
05/07/23   CC:  Chief Complaint  Patient presents with   Cysto    HPI: Nathan Holland is a 67 y.o. male with a personal history of nonmuscle invasive bladder cancer high-grade T1/CIS, prostate cancer, nephrolithiasis, and urge incontinence, who presents today for a cystoscopy.   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     He is s/p induction BCG and maintenance BCG in 04/2021.    He most recently returned the the OR in 12/2020 for urethral dilation, bladder biopsies, bilateral retrograde pyelogram. All were consistent with benign etiologies.   He underwent a cystoscopy with urethral dilation on 11/16/2021.   His catheter was removed on 11/19/2021 by Michiel Cowboy, PA-c.   He was seen back in clinic on 12/10/2021 for a complicated catheter placement with Dr. Lonna Cobb.  Estimated volume by bladder scan was . He was given Levaquin post dilation.   He returned to the operating room on 01/31/2022 for urethral dilation and cystoscopy.  Time, there is no evidence of disease.  After this procedure, he underwent CIC teaching and reports that he was only successful once here in the office but never at home and quit doing it.  At the time of scheduled BCG maintenance, were unable to pass the scope.  Ended up having to scope the catheter in.  We end up leaving the catheter in and he was treated with 3 maintenance doses of BCG.  He returns today for cystoscopy.  He has no urinary complaints today.  He does report that he has erectile dysfunction.  It has been going on for quite some time but worsening.  He is never tried sildenafil but requesting a prescription today.  Urinalysis today is abnormal, greater than 30 WBC  blood cells, 3-10 red blood cells, nitrate negative with moderate bacteria.    Vitals:   05/07/23 1527  BP: (!) 149/73  Pulse: 86   NED. A&Ox3.   No respiratory distress   Abd soft, NT, ND Normal phallus with bilateral descended  testicles  Cystoscopy Procedure Note  Patient identification was confirmed, informed consent was obtained, and patient was prepped using Betadine solution.  Lidocaine jelly was administered per urethral meatus.     Pre-Procedure: - Inspection reveals a normal caliber ureteral meatus.  Procedure: Shaggy rugged urethral stricture near the apex of the prostate as per previous.  The true lumen was able to be visualized and with careful navigation, I was ultimately able to advance the scope through his prostate into his bladder.  The bladder itself was mildly trabeculated without ulcerations tumors or lesions.  There is persistent nonspecific erythema, patchy diffuse throughout the bladder.  Stellate scar on left lateral bladder wall.  There is also 1 area on the anterior bladder wall somewhat raised in texture eyes but no overt tumor at this location, relatively small about 5 mm.  Retroflexion unremarkable   Post-Procedure: - Patient tolerated the procedure well   Assessment/ Plan:  1. 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 Malignant neoplasm of urinary bladder, unspecified site (HCC) -NED today -Urine cytology today long with urine culture to rule out infection is contributing factor to his waxing waning erythema and appearance of his bladder today -Given his history of waxing/ waning areas of erythema, will continue to follow him on a q. 21-month basis; specific attention to  the area on the anterior bladder wall and follow-up visit -Due for upper tract surveillance imaging given personal history of high risk bladder cancer.  Will plan for CT urogram.  3.  Erectile dysfunction -We discussed the pathophysiology of erectile dysfunction today along with possible contributing factors. Discussed possible treatment  options including PDE 5 inhibitors as initial intervention.  In terms of PDE 5 inhibitors, we discussed contraindications for this medication as well as common side effects. Patient was counseled on optimal use. All of his questions were answered in detail.  Prescription for sildenafil 20 mg up to 100 mg was prescribed.  Possible side effects discussed.  He has no contraindications.   Cystoscopy in 3 months/ f/U CT scan   Vanna Scotland, MD

## 2023-05-07 NOTE — Addendum Note (Signed)
Addended by: Consuella Lose on: 05/07/2023 03:53 PM   Modules accepted: Orders

## 2023-05-12 ENCOUNTER — Other Ambulatory Visit: Payer: Self-pay | Admitting: *Deleted

## 2023-05-12 MED ORDER — SULFAMETHOXAZOLE-TRIMETHOPRIM 800-160 MG PO TABS
1.0000 | ORAL_TABLET | Freq: Two times a day (BID) | ORAL | 0 refills | Status: AC
Start: 2023-05-12 — End: 2023-05-19

## 2023-05-16 ENCOUNTER — Ambulatory Visit
Admission: RE | Admit: 2023-05-16 | Discharge: 2023-05-16 | Disposition: A | Discharge status: Home / Self Care | Payer: Medicare HMO | Source: Ambulatory Visit | Attending: Urology | Admitting: Urology

## 2023-05-16 DIAGNOSIS — D494 Neoplasm of unspecified behavior of bladder: Secondary | ICD-10-CM | POA: Diagnosis not present

## 2023-05-16 DIAGNOSIS — C679 Malignant neoplasm of bladder, unspecified: Secondary | ICD-10-CM | POA: Diagnosis not present

## 2023-05-16 DIAGNOSIS — C61 Malignant neoplasm of prostate: Secondary | ICD-10-CM | POA: Diagnosis not present

## 2023-05-16 DIAGNOSIS — Z8551 Personal history of malignant neoplasm of bladder: Secondary | ICD-10-CM | POA: Insufficient documentation

## 2023-05-16 DIAGNOSIS — N200 Calculus of kidney: Secondary | ICD-10-CM | POA: Diagnosis not present

## 2023-05-16 LAB — POCT I-STAT CREATININE: Creatinine, Ser: 1 mg/dL (ref 0.61–1.24)

## 2023-05-16 MED ORDER — IOHEXOL 300 MG/ML  SOLN
100.0000 mL | Freq: Once | INTRAMUSCULAR | Status: AC | PRN
  Administered 2023-05-16: 100 mL via INTRAVENOUS

## 2023-05-26 ENCOUNTER — Telehealth: Payer: Self-pay | Admitting: Urology

## 2023-05-26 NOTE — Telephone Encounter (Signed)
Patient called to request results from CT done on 05/16/23. I relayed Dr. Delana Meyer note in chart that CT scan looks great. Patient verbalized understanding. He had not seen note in chart, as he does not use mychart. He requested that his mychart be deactivated, and requests phone calls in future.

## 2023-06-12 DIAGNOSIS — R7303 Prediabetes: Secondary | ICD-10-CM | POA: Diagnosis not present

## 2023-06-12 DIAGNOSIS — I10 Essential (primary) hypertension: Secondary | ICD-10-CM | POA: Diagnosis not present

## 2023-06-13 ENCOUNTER — Ambulatory Visit: Payer: Medicare HMO

## 2023-06-13 DIAGNOSIS — Z1211 Encounter for screening for malignant neoplasm of colon: Secondary | ICD-10-CM | POA: Diagnosis not present

## 2023-06-13 DIAGNOSIS — K635 Polyp of colon: Secondary | ICD-10-CM | POA: Diagnosis not present

## 2023-06-13 DIAGNOSIS — K5730 Diverticulosis of large intestine without perforation or abscess without bleeding: Secondary | ICD-10-CM | POA: Diagnosis not present

## 2023-06-13 DIAGNOSIS — K640 First degree hemorrhoids: Secondary | ICD-10-CM | POA: Diagnosis not present

## 2023-06-13 DIAGNOSIS — K648 Other hemorrhoids: Secondary | ICD-10-CM | POA: Diagnosis not present

## 2023-06-13 DIAGNOSIS — Z86010 Personal history of colonic polyps: Secondary | ICD-10-CM | POA: Diagnosis not present

## 2023-06-18 ENCOUNTER — Telehealth: Payer: Self-pay

## 2023-06-18 DIAGNOSIS — I10 Essential (primary) hypertension: Secondary | ICD-10-CM | POA: Diagnosis not present

## 2023-06-18 DIAGNOSIS — R7303 Prediabetes: Secondary | ICD-10-CM | POA: Diagnosis not present

## 2023-06-18 DIAGNOSIS — R062 Wheezing: Secondary | ICD-10-CM | POA: Diagnosis not present

## 2023-06-18 DIAGNOSIS — N528 Other male erectile dysfunction: Secondary | ICD-10-CM

## 2023-06-18 NOTE — Telephone Encounter (Signed)
Call left on vm  Pt states he has tried the sildenafil  and it is not working. He has tried up to 5 tablets.   [facility] [address]  Pls advise.

## 2023-06-18 NOTE — Telephone Encounter (Signed)
He can try generic tadalafil which is Cialis 20 mg.  This has to be taken at least 2 to 3 hours prior to intercourse ideally on an empty stomach.  If this is not effective, then please schedule an appointment with one of our PAs to discuss alternative options.  Vanna Scotland, MD

## 2023-06-19 MED ORDER — TADALAFIL 20 MG PO TABS
ORAL_TABLET | ORAL | 11 refills | Status: DC
Start: 2023-06-19 — End: 2024-07-21

## 2023-06-19 NOTE — Telephone Encounter (Signed)
Patient advised and medication sent in.

## 2023-07-30 ENCOUNTER — Ambulatory Visit: Payer: Medicare HMO | Admitting: Urology

## 2023-07-30 VITALS — BP 125/73 | HR 86 | Ht 72.0 in | Wt 233.1 lb

## 2023-07-30 DIAGNOSIS — C679 Malignant neoplasm of bladder, unspecified: Secondary | ICD-10-CM | POA: Diagnosis not present

## 2023-07-30 DIAGNOSIS — R8281 Pyuria: Secondary | ICD-10-CM | POA: Diagnosis not present

## 2023-07-30 DIAGNOSIS — Z8551 Personal history of malignant neoplasm of bladder: Secondary | ICD-10-CM | POA: Diagnosis not present

## 2023-07-30 DIAGNOSIS — N9989 Other postprocedural complications and disorders of genitourinary system: Secondary | ICD-10-CM

## 2023-07-30 DIAGNOSIS — N35819 Other urethral stricture, male, unspecified site: Secondary | ICD-10-CM

## 2023-07-30 DIAGNOSIS — R3915 Urgency of urination: Secondary | ICD-10-CM

## 2023-07-30 LAB — URINALYSIS, COMPLETE
Bilirubin, UA: NEGATIVE
Glucose, UA: NEGATIVE
Ketones, UA: NEGATIVE
Nitrite, UA: NEGATIVE
Protein,UA: NEGATIVE
RBC, UA: NEGATIVE
Specific Gravity, UA: 1.01 (ref 1.005–1.030)
Urobilinogen, Ur: 1 mg/dL (ref 0.2–1.0)
pH, UA: 6.5 (ref 5.0–7.5)

## 2023-07-30 LAB — MICROSCOPIC EXAMINATION

## 2023-07-30 MED ORDER — TAMSULOSIN HCL 0.4 MG PO CAPS: 0.4000 mg | ORAL_CAPSULE | ORAL | 11 refills | Status: DC | PRN

## 2023-07-30 NOTE — Progress Notes (Signed)
   07/30/23   CC:  Chief Complaint  Patient presents with   Cysto    HPI: Nathan Holland is a 67 y.o. male with a personal history of nonmuscle invasive bladder cancer high-grade T1/CIS, prostate cancer, nephrolithiasis, and urge incontinence, who presents today for a cystoscopy.   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     He is s/p induction BCG and maintenance BCG in 04/2021.    He most recently returned the the OR in 12/2020 for urethral dilation, bladder biopsies, bilateral retrograde pyelogram. All were consistent with benign etiologies.   He underwent a cystoscopy with urethral dilation on 11/16/2021.   His catheter was removed on 11/19/2021 by Michiel Cowboy, PA-c.   He was seen back in clinic on 12/10/2021 for a complicated catheter placement with Dr. Lonna Cobb.  Estimated volume by bladder scan was . He was given Levaquin post dilation.   He returned to the operating room on 01/31/2022 for urethral dilation and cystoscopy.  Time, there is no evidence of disease.  After this procedure, he underwent CIC teaching and reports that he was only successful once here in the office but never at home and quit doing it.  At the time of scheduled BCG maintenance, were unable to pass the scope.  Ended up having to scope the catheter in.  We end up leaving the catheter in and he was treated with 3 maintenance doses of BCG.  He returns today for cystoscopy.  He ran out of flomax and feels like this was helping, requesting refill today.    Urinalysis today is abnormal, greater than 30 WBC  blood cells,, nitrate negative with moderate bacteria.  CT urogram 05/21/23 negative.     NED. A&Ox3.   No respiratory distress   Abd soft, NT, ND Normal phallus with bilateral descended testicles  Cystoscopy Procedure Note  Patient identification was confirmed, informed consent was obtained, and patient was prepped using Betadine solution.  Lidocaine jelly  was administered per urethral meatus.     Pre-Procedure: - Inspection reveals a normal caliber ureteral meatus.  Procedure: Shaggy rugged urethral stricture near the apex of the prostate as per previous.  The true lumen was able to be visualized and with careful navigation, I was ultimately able to advance the scope through his prostate into his bladder.  Stable from previous.    The bladder itself was mildly trabeculated without ulcerations tumors or lesions.  There is persistent nonspecific erythema, patchy diffuse throughout the bladder.  Stellate scar on left lateral bladder wall.    Retroflexion unremarkable  Post-Procedure: - Patient tolerated the procedure well   Assessment/ Plan:  1. 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 Malignant neoplasm of urinary bladder, unspecified site (HCC) -NED today -Urine cytology today long with urine culture to rule out infection is contributing factor to his waxing waning erythema and appearance of his bladder today (stable from previous) -Given his history of waxing/ waning areas of erythema, will continue to follow him on a q. 76-month basis; specific attention to the area on the anterior bladder wall and follow-up visit -Upper tract imaging negative which is reassuring.    3. Urinary symptoms -Flomax refilled  Cystoscopy in 3 months   Vanna Scotland, MD

## 2023-08-02 LAB — CULTURE, URINE COMPREHENSIVE

## 2023-09-25 DIAGNOSIS — M25562 Pain in left knee: Secondary | ICD-10-CM | POA: Diagnosis not present

## 2023-10-29 ENCOUNTER — Ambulatory Visit (INDEPENDENT_AMBULATORY_CARE_PROVIDER_SITE_OTHER): Payer: Medicare HMO | Admitting: Urology

## 2023-10-29 VITALS — BP 111/69 | HR 71 | Ht 72.0 in | Wt 234.0 lb

## 2023-10-29 DIAGNOSIS — Z8551 Personal history of malignant neoplasm of bladder: Secondary | ICD-10-CM | POA: Diagnosis not present

## 2023-10-29 DIAGNOSIS — R3915 Urgency of urination: Secondary | ICD-10-CM

## 2023-10-29 DIAGNOSIS — C679 Malignant neoplasm of bladder, unspecified: Secondary | ICD-10-CM

## 2023-10-29 DIAGNOSIS — N99114 Postprocedural urethral stricture, male, unspecified: Secondary | ICD-10-CM

## 2023-10-29 LAB — URINALYSIS, COMPLETE
Bilirubin, UA: NEGATIVE
Glucose, UA: NEGATIVE
Ketones, UA: NEGATIVE
Nitrite, UA: NEGATIVE
Protein,UA: NEGATIVE
RBC, UA: NEGATIVE
Specific Gravity, UA: 1.015 (ref 1.005–1.030)
Urobilinogen, Ur: 0.2 mg/dL (ref 0.2–1.0)
pH, UA: 7 (ref 5.0–7.5)

## 2023-10-29 LAB — MICROSCOPIC EXAMINATION

## 2023-10-29 MED ORDER — TAMSULOSIN HCL 0.4 MG PO CAPS: 0.4000 mg | ORAL_CAPSULE | Freq: Every day | ORAL | 11 refills | Status: DC

## 2023-10-29 NOTE — Addendum Note (Signed)
Addended by: Consuella Lose on: 10/29/2023 02:08 PM   Modules accepted: Orders

## 2023-10-29 NOTE — Progress Notes (Signed)
   10/29/23   CC:  Chief Complaint  Patient presents with   Cysto    HPI: Nathan Holland is a 68 y.o. male with a personal history of nonmuscle invasive bladder cancer high-grade T1/CIS, prostate cancer, nephrolithiasis, and urge incontinence, who presents today for a cystoscopy.   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     He is s/p induction BCG and maintenance BCG in 04/2021.    He most recently returned the the OR in 12/2020 for urethral dilation, bladder biopsies, bilateral retrograde pyelogram. All were consistent with benign etiologies.   He underwent a cystoscopy with urethral dilation on 11/16/2021.   His catheter was removed on 11/19/2021 by Michiel Cowboy, PA-c.   He was seen back in clinic on 12/10/2021 for a complicated catheter placement with Dr. Lonna Cobb.  Estimated volume by bladder scan was . He was given Levaquin post dilation.   He returned to the operating room on 01/31/2022 for urethral dilation and cystoscopy.  Time, there is no evidence of disease.  After this procedure, he underwent CIC teaching and reports that he was only successful once here in the office but never at home and quit doing it.  At the time of scheduled BCG maintenance, were unable to pass the scope.  Ended up having to scope the catheter in.  We end up leaving the catheter in and he was treated with 3 maintenance doses of BCG.  He returns today for cystoscopy.  Again this time, he is requesting a Flomax refill.  He requested this last time as well.  CT urogram 05/21/23 negative.     NED. A&Ox3.   No respiratory distress   Abd soft, NT, ND Normal phallus with bilateral descended testicles  Cystoscopy Procedure Note  Patient identification was confirmed, informed consent was obtained, and patient was prepped using Betadine solution.  Lidocaine jelly was administered per urethral meatus.     Pre-Procedure: - Inspection reveals a normal caliber  ureteral meatus.  Procedure: Rugged urethral stricture near the apex of the prostate as per previous.  The true lumen was able to be visualized and with careful navigation, I was ultimately able to advance the scope through his prostate into his bladder.  Half through his prostatic urethra is somewhat torturous and nodular especially near the apex.  Stable from previous.    The bladder itself was mildly trabeculated without ulcerations tumors or lesions.  Left lateral bladder wall stellate scar appreciated.  Slightly less pronounced erythema today in the bladder, reassuring.  No obvious tumors masses or lesions. Retroflexion unremarkable  Post-Procedure: - Patient tolerated the procedure well   Assessment/ Plan:  1. 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 Malignant neoplasm of urinary bladder, unspecified site [facility]) -Prostatic/membranous urethral stricture is somewhat more pronounced today.  It is also more nodular in appearance.  Will continue to monitor this. -Bladder appears more normal today -Will send urine cytology today if possible -Continue to monitor closely on a every 6 month basis given findings today  3. Urinary symptoms -Flomax refilled  Cystoscopy in 3 months   Vanna Scotland, MD

## 2024-01-27 ENCOUNTER — Ambulatory Visit: Payer: Self-pay | Admitting: Urology

## 2024-01-27 VITALS — BP 124/72 | HR 73

## 2024-01-27 DIAGNOSIS — C679 Malignant neoplasm of bladder, unspecified: Secondary | ICD-10-CM | POA: Diagnosis not present

## 2024-01-27 LAB — URINALYSIS, COMPLETE
Bilirubin, UA: NEGATIVE
Glucose, UA: NEGATIVE
Ketones, UA: NEGATIVE
Nitrite, UA: NEGATIVE
Specific Gravity, UA: 1.025 (ref 1.005–1.030)
Urobilinogen, Ur: 0.2 mg/dL (ref 0.2–1.0)
pH, UA: 6 (ref 5.0–7.5)

## 2024-01-27 LAB — MICROSCOPIC EXAMINATION: WBC, UA: >30 /HPF — AB (ref 0–5)

## 2024-01-27 NOTE — Progress Notes (Signed)
   01/27/24   CC:  Chief Complaint  Patient presents with   Cysto    HPI: Nathan Holland is a 68 y.o. male with a personal history of nonmuscle invasive bladder cancer high-grade T1/CIS, prostate cancer, nephrolithiasis, and urge incontinence, who presents today for a cystoscopy.   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     He is s/p induction BCG and maintenance BCG in 04/2021.    He most recently returned the the OR in 12/2020 for urethral dilation, bladder biopsies, bilateral retrograde pyelogram. All were consistent with benign etiologies.   He underwent a cystoscopy with urethral dilation on 11/16/2021.   His catheter was removed on 11/19/2021 by Matilde Son, PA-c.   He was seen back in clinic on 12/10/2021 for a complicated catheter placement with Dr. Cherylene Corrente.  Estimated volume by bladder scan was . He was given Levaquin  post dilation.   He returned to the operating room on 01/31/2022 for urethral dilation and cystoscopy.  Time, there is no evidence of disease.  After this procedure, he underwent CIC teaching and reports that he was only successful once here in the office but never at home and quit doing it.  At the time of scheduled BCG maintenance, were unable to pass the scope.  Ended up having to scope the catheter in.  We end up leaving the catheter in and he was treated with 3 maintenance doses of BCG.  CT urogram 05/21/23 negative.    No complaints today.    NED. A&Ox3.   No respiratory distress   Abd soft, NT, ND Normal phallus with bilateral descended testicles  Cystoscopy Procedure Note  Patient identification was confirmed, informed consent was obtained, and patient was prepped using Betadine solution.  Lidocaine  jelly was administered per urethral meatus.     Pre-Procedure: - Inspection reveals a normal caliber ureteral meatus.  Procedure: Rugged urethral stricture near the apex of the prostate as per previous.   The true lumen was able to be visualized and with careful navigation, scope was advanced through his prostate into his bladder following the anterior channel.  Half through his prostatic urethra is somewhat torturous and nodular especially near the apex.  Stable from previous.    The bladder itself was mildly trabeculated without ulcerations tumors or lesions.  Left lateral bladder wall stellate scar appreciated with very mild stable erythema at the dome.  No obvious tumors masses or lesions. Retroflexion unremarkable  Post-Procedure: - Patient tolerated the procedure well   Assessment/ Plan:  1. 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 Malignant neoplasm of urinary bladder, unspecified site [facility]) -Bladder appears stable -Will send urine cytology today  -Continue to monitor closely on a every 6 month basis given findings today  3. Urinary symptoms -Contune  Cystoscopy in 6 months   Dustin Gimenez, MD

## 2024-05-10 ENCOUNTER — Encounter: Payer: Self-pay | Admitting: Urology

## 2024-05-24 NOTE — Progress Notes (Signed)
 No chief complaint on file.   HPI  Nathan Holland is a 68 y.o. here for an acute issue.   He has a history of prostate cancer, bladder cancer, kidney stones, COVID who also deals with intermittent chronic leg cramps.  Last night had a severe cramp to both legs mostly in the left calf.  Took over-the-counter treatments but does not remember the name.  Denies excess sweating.  No chest pain or shortness of breath.  No rash or swelling of the lower extremities.  He does work outside at times.    ROS  Pertinent items are noted in HPI.  Outpatient Encounter Medications as of 05/24/2024  Medication Sig Dispense Refill  . alfuzosin (UROXATRAL) 10 mg ER tablet Take 1 tablet (10 mg total) by mouth once daily 30 tablet 11  . ascorbic acid, vitamin C, (VITAMIN C) 1000 MG tablet Take 1,000 mg by mouth once daily    . cetirizine (ZYRTEC) 10 MG tablet Take 10 mg by mouth once daily    . fluticasone propion-salmeteroL (ADVAIR DISKUS) 250-50 mcg/dose diskus inhaler Inhale 1 Puff into the lungs every 12 (twelve) hours 60 each 12  . losartan (COZAAR) 25 MG tablet TAKE 1 TABLET BY MOUTH EVERY DAY 30 tablet 11  . omeprazole magnesium  (PRILOSEC OTC ORAL) Take by mouth One daily    . tadalafiL  (CIALIS ) 5 MG tablet Take 1 tablet (5 mg total) by mouth once daily for 180 days 30 tablet 5  . triamcinolone 0.1 % cream Apply topically 2 (two) times daily 30 g 0  . zinc 50 mg Tab Take by mouth    . acetaminophen  (TYLENOL  ORAL) Take by mouth once daily as needed (Patient not taking: Reported on 05/24/2024)     No facility-administered encounter medications on file as of 05/24/2024.    Allergies as of 05/24/2024  . (No Known Allergies)    Past Medical History:  Diagnosis Date  . Bladder cancer (CMS/HHS-HCC)   . COVID-19 09/2019    Past Surgical History:  Procedure Laterality Date  . COLONOSCOPY  08/12/2005   Dr. EMERSON Mariner @ [facility] - Diverticulosis  . COLONOSCOPY  08/05/2018   Tubular adenoma of the  colon/Repeat 51yrs/MUS  . Colon @ [facility]  06/13/2023   Normal colon biopsy/PHx CP/Repeat 27yrs/[facility]  . INCISION AND DRAINAGE ABSCESS / HEMATOMA OF BURSA / KNEE / THIGH Right    2012 - prepatellar    Vitals:   05/24/24 1031  BP: (!) 140/80  Pulse: 68    Physical Exam  General. Well appearing; NAD; VS reviewed     HEENT: Sclera and conjunctiva clear; EOMI, Lungs. Respirations unlabored; clear to auscultation bilaterally. Cardiovascular. Heart regular rate and rhythm without murmurs, gallops, or rubs. Extremities:  No edema. Skin. Normal color and turgor Neurologic. Alert and oriented x3   Assessment and Plan 1. Leg cramps Chronic in nature.  Severe cramp last night.  Check labs.  Discussed supplementing with electrolyte drinks.  Consider switching losartan. -     Basic Metabolic Panel (BMP) -     CBC w/auto Differential (5 Part) -     Magnesium   2. Essential hypertension     I have personally performed this service.  [address] [place], [place]

## 2024-06-04 ENCOUNTER — Emergency Department
Admission: EM | Admit: 2024-06-04 | Discharge: 2024-06-04 | Disposition: A | Discharge status: Home / Self Care | Source: Ambulatory Visit

## 2024-06-04 ENCOUNTER — Other Ambulatory Visit: Payer: Self-pay

## 2024-06-04 ENCOUNTER — Emergency Department

## 2024-06-04 DIAGNOSIS — Z8551 Personal history of malignant neoplasm of bladder: Secondary | ICD-10-CM | POA: Insufficient documentation

## 2024-06-04 DIAGNOSIS — I959 Hypotension, unspecified: Secondary | ICD-10-CM | POA: Diagnosis not present

## 2024-06-04 DIAGNOSIS — R002 Palpitations: Secondary | ICD-10-CM | POA: Insufficient documentation

## 2024-06-04 DIAGNOSIS — Z8616 Personal history of COVID-19: Secondary | ICD-10-CM | POA: Diagnosis not present

## 2024-06-04 DIAGNOSIS — F1520 Other stimulant dependence, uncomplicated: Secondary | ICD-10-CM | POA: Insufficient documentation

## 2024-06-04 DIAGNOSIS — Z8546 Personal history of malignant neoplasm of prostate: Secondary | ICD-10-CM | POA: Insufficient documentation

## 2024-06-04 LAB — BASIC METABOLIC PANEL WITH GFR
Anion gap: 11 (ref 5–15)
BUN: 21 mg/dL (ref 8–23)
CO2: 18 mmol/L — ABNORMAL LOW (ref 22–32)
Calcium: 8.7 mg/dL — ABNORMAL LOW (ref 8.9–10.3)
Chloride: 108 mmol/L (ref 98–111)
Creatinine, Ser: 0.81 mg/dL (ref 0.61–1.24)
GFR, Estimated: >60 mL/min (ref >60)
Glucose, Bld: 128 mg/dL — ABNORMAL HIGH (ref 70–99)
Potassium: 4.1 mmol/L (ref 3.5–5.1)
Sodium: 137 mmol/L (ref 135–145)

## 2024-06-04 LAB — CBC
HCT: 41.3 % (ref 39.0–52.0)
Hemoglobin: 14 g/dL (ref 13.0–17.0)
MCH: 31.5 pg (ref 26.0–34.0)
MCHC: 33.9 g/dL (ref 30.0–36.0)
MCV: 92.8 fL (ref 80.0–100.0)
Platelets: 159 K/uL (ref 150–400)
RBC: 4.45 MIL/uL (ref 4.22–5.81)
RDW: 12.8 % (ref 11.5–15.5)
WBC: 11.5 K/uL — ABNORMAL HIGH (ref 4.0–10.5)
nRBC: 0 % (ref 0.0–0.2)

## 2024-06-04 LAB — TROPONIN I (HIGH SENSITIVITY): Troponin I (High Sensitivity): <2 ng/L (ref <18)

## 2024-06-04 LAB — MAGNESIUM: Magnesium: 1.9 mg/dL (ref 1.7–2.4)

## 2024-06-04 NOTE — ED Provider Notes (Signed)
 [facility] Provider Note    Event Date/Time   First MD Initiated Contact with Patient 06/04/24 1523     (approximate)   History   Chest Pain   HPI  Nathan Holland is a 68 y.o. male  history of prostate cancer, bladder cancer, kidney stones, COVID who presents today for an episode of palpitations now resolved.  Patient states that he had just finished drinking his fourth cup of coffee at approximately 10 AM (5 hours prior to arrival) when he felt his heart racing.  This episode lasted approximately 30 minutes but he did feel lightheaded with that.  Denies any chest pain or shortness of breath with the symptoms.  He initially went to [place] [facility] but given his complaint was subsequently sent to our facility.  He has not seen a cardiologist.  He does have a prior history of drinking alcohol and smoking but he quit roughly around 2006.  He has not seen a cardiologist previously.  He tells me that he normally drinks 7 to 8 cups of coffee daily.  He is currently asymptomatic and his sister is with him who contributes to the history      Physical Exam   Triage Vital Signs: ED Triage Vitals [06/04/24 1456]  Encounter Vitals Group     BP 120/60     Girls Systolic BP Percentile      Girls Diastolic BP Percentile      Boys Systolic BP Percentile      Boys Diastolic BP Percentile      Pulse Rate 91     Resp 17     Temp 98 F (36.7 C)     Temp Source Oral     SpO2 98 %     Weight      Height      Head Circumference      Peak Flow      Pain Score 4     Pain Loc      Pain Education      Exclude from Growth Chart     Most recent vital signs: Vitals:   06/04/24 1456  BP: 120/60  Pulse: 91  Resp: 17  Temp: 98 F (36.7 C)  SpO2: 98%    Nursing Triage Note reviewed. Vital signs reviewed and patients oxygen saturation is normoxic  General: Patient is well nourished, well developed, awake and alert, resting comfortably in no acute  distress Head: Normocephalic and atraumatic Eyes: Normal inspection, extraocular muscles intact, no conjunctival pallor Ear, nose, throat: Normal external exam Neck: Normal range of motion Respiratory: Patient is in no respiratory distress, lungs CTAB Cardiovascular: Patient is not tachycardic, RRR without murmur appreciated GI: Abd SNT with no guarding or rebound  Back: Normal inspection of the back with good strength and range of motion throughout all ext Extremities: pulses intact with good cap refills, no LE pitting edema or calf tenderness Neuro: The patient is alert and oriented to person, place, and time, appropriately conversive, with 5/5 bilat UE/LE strength, no gross motor or sensory defects noted. Coordination appears to be adequate. Skin: Warm, dry, and intact Psych: normal mood and affect, no SI or HI  ED Results / Procedures / Treatments   Labs (all labs ordered are listed, but only abnormal results are displayed) Labs Reviewed  BASIC METABOLIC PANEL WITH GFR - Abnormal; Notable for the following components:      Result Value   CO2 18 (*)    Glucose, Bld  128 (*)    Calcium 8.7 (*)    All other components within normal limits  CBC - Abnormal; Notable for the following components:   WBC 11.5 (*)    All other components within normal limits  MAGNESIUM   TROPONIN I (HIGH SENSITIVITY)     EKG EKG and rhythm strip are interpreted by myself: 15:24  EKG: [Normal sinus rhythm] at heart rate of 85, normal QRS duration, QTc 428, normal ST segments and T waves no ectopy EKG not consistent with Acute STEMI Rhythm strip: NSR in lead II   RADIOLOGY Xray chest: No acute findings on my independent review interpretation radiologist agrees    PROCEDURES:  Critical Care performed: No  Procedures   MEDICATIONS ORDERED IN ED: Medications - No data to display   IMPRESSION / MDM / ASSESSMENT AND PLAN / ED COURSE                                Differential diagnosis  includes, but is not limited to, arrhythmia, electrolyte derangement, atypical ACS, effects of caffeine, anemia   ED course: Patient is well-appearing and asymptomatic currently.  EKG here demonstrates normal sinus rhythm.  Chest x-ray demonstrated no pneumonia/opacities.  He is not anemic and only has a very mild leukocytosis.  A troponin is pending along with electrolyte panel and magnesium .  At this time I cannot rule out that he is not going intermittently into arrhythmia such as A-fib.  I do suspect his large amount of caffeine is contributing and he was counseled on slow cessation.  I do not think his symptoms are consistent with PE as he is satting 98% on room air with and denies ever having any chest pain or shortness of breath.  He has no significant risk factors for PE/low risk [place]/ I have placed a cardiology follow-up for the patient.  If workup is unremarkable, patient can likely return home with closed outpatient cardiology follow up/consideration of zio patch/holter   Clinical Course as of 06/04/24 1607  Fri Jun 04, 2024  1525 DG Chest 2 View Chest x-ray unremarkable [HD]  1525 WBC(!): 11.5 Mild leukocytosis [HD]  1557 Magnesium : 1.9 Not incredibly low [HD]  1557 HCT: 41.3 Not anemic [HD]  1604 Troponin I (High Sensitivity): <2 Not elevated [HD]  1604 Creatinine: 0.81 AKI [HD]  1604 Basic metabolic panel(!) No significant electrolyte derangements [HD]  1607 Patient remains asymptomatic.  He is in normal sinus rhythm on the monitor.  He feels comfortable returning home.  He understands that I have placed an outpatient cardiology consult.  All questions answered the patient and sister voiced understanding and requested discharge [HD]    Clinical Course User Index [HD] Nicholaus Rolland BRAVO, MD  At time of discharge there is no evidence of acute life, limb, vision, or fertility threat. Patient has stable vital signs, pain is well controlled, patient is ambulatory and p.o.  tolerant.  Discharge instructions were completed using the [employer] system. I would refer you to those at this time. All warnings prescriptions follow-up etc. were discussed in detail with the patient. Patient indicates understanding and is agreeable with this plan. All questions answered.  Patient is made aware that they may return to the emergency department for any worsening or new condition or for any other emergency. -- Risk: 5 This patient has a high risk of morbidity due to further diagnostic testing or treatment. Rationale: This patient's evaluation and management  involve a high risk of morbidity due to the potential severity of presenting symptoms, need for diagnostic testing, and/or initiation of treatment that may require close monitoring. The differential includes conditions with potential for significant deterioration or requiring escalation of care. Treatment decisions in the ED, including medication administration, procedural interventions, or disposition planning, reflect this level of risk. Additional Support: -- Drug therapy requiring intensive monitoring for toxicity [ ]  -- Decision regarding elective major surgery with idenitified patient or procedure risk factors [ ]  -- Decision regarding hospitalization or escalation of hospital-level care [ ]  -- Decision not to resuscitate or to de-escalate care because of poor prognosis [ ]  -- Parental controlled substances [ ]   COPA: 5 The patient has a severe exacerbation, progression, or side effect of treatment of the following illness/illnesses: []  OR  The patient has the following acute or chronic illness/injury that poses a possible threat to life or bodily function: [X] : The patient has a potentially serious acute condition or an acute exacerbation of a chronic illness requiring urgent evaluation and management in the Emergency Department. The clinical presentation necessitates immediate consideration of life-threatening or  function-threatening diagnoses, even if they are ultimately ruled out.  Data(2/3 categories following were performed): 5 I reviewed or ordered at least three unique tests, external notes, and/or the history required an independent historian as one of the three requirements as following: CBC, BMP, magnesium , troponin AND  I independently interpreted the following test: X-ray of chest OR  I discussed the management of the patient with the following external physician or qualified healthcare provider: []   Suggested E/M Coding Level: 5, 99285, This has been selected based on the 13-Jun-2022 CPT guidelines for E/M codes in the Emergency Department based on 2/3 of the CoPA, Data, and Risk.   FINAL CLINICAL IMPRESSION(S) / ED DIAGNOSES   Final diagnoses:  Palpitations  Transient hypotension  Caffeine addiction (HCC)     Rx / DC Orders   ED Discharge Orders          Ordered    Ambulatory referral to Cardiology       Comments: If you have not heard from the Cardiology office within the next 72 hours please call [phone].   06/04/24 1532             Note:  This document was prepared using Dragon voice recognition software and may include unintentional dictation errors.   Nicholaus Rolland BRAVO, MD 06/04/24 [phone]

## 2024-06-04 NOTE — Discharge Instructions (Addendum)
 You were seen in the emergency department for an episode of palpitations.  Workup today was reassuring and you are safe to return home.  This does not mean that everything is normal and I do wonder whether you are going in and out of arrhythmia.  I have placed an outpatient consult to cardiology.  Please ensure that your phone number is correct in the system and answer all calls.  Please call your primary care physician as well.  Do your best to cut down on your caffeine as I suspect this may be contributing.  Ensure adequate hydration and return with any acutely worsening symptoms or any other emergency. -- RETURN PRECAUTIONS & AFTERCARE: (ENGLISH) RETURN PRECAUTIONS: Return immediately to the emergency department or see/call your doctor if you feel worse, weak or have changes in speech or vision, are short of breath, have fever, vomiting, pain, bleeding or dark stool, trouble urinating or any new issues. Return here or see/call your doctor if not improving as expected for your suspected condition. FOLLOW-UP CARE: Call your doctor and/or any doctors we referred you to for more advice and to make an appointment. Do this today, tomorrow or after the weekend. Some doctors only take PPO insurance so if you have HMO insurance you may want to contact your HMO or your regular doctor for referral to a specialist within your plan. Either way tell the doctor's office that it was a referral from the emergency department so you get the soonest possible appointment.  YOUR TEST RESULTS: Take result reports of any blood or urine tests, imaging tests and EKG's to your doctor and any referral doctor. Have any abnormal tests repeated. Your doctor or a referral doctor can let you know when this should be done. Also make sure your doctor contacts this hospital to get any test results that are not currently available such as cultures or special tests for infection and final imaging reports, which are often not available at the  time you leave the ER but which may list additional important findings that are not documented on the preliminary report. BLOOD PRESSURE: If your blood pressure was greater than 120/80 have your blood pressure rechecked within 1 to 2 weeks. MEDICATION SIDE EFFECTS: Do not drive, walk, bike, take the bus, etc. if you have received or are being prescribed any sedating medications such as those for pain or anxiety or certain antihistamines like Benadryl . If you have been give one of these here get a taxi home or have a friend drive you home. Ask your pharmacist to counsel you on potential side effects of any new medication

## 2024-06-04 NOTE — ED Triage Notes (Signed)
 Pt to ED via POV from [facility]. Pt was at work and started to have palpitations. Pt checked BP and HR at work and Hr was elevated and BP was low. Pt denies dizziness, SOB or extraneous work. Np cardiac hx  Pt was normotensive at [facility] and HR WNL. Pt sent for further evaluation.

## 2024-06-08 ENCOUNTER — Ambulatory Visit

## 2024-06-08 VITALS — BP 144/60 | HR 78 | Ht 71.0 in | Wt 251.8 lb

## 2024-06-08 DIAGNOSIS — R002 Palpitations: Secondary | ICD-10-CM

## 2024-06-08 DIAGNOSIS — I951 Orthostatic hypotension: Secondary | ICD-10-CM

## 2024-06-08 DIAGNOSIS — E860 Dehydration: Secondary | ICD-10-CM | POA: Diagnosis not present

## 2024-06-08 NOTE — Progress Notes (Signed)
  Cardiology Office Note   Date:  06/08/2024  ID:  Nathan Holland, DOB 16-Jun-1956, MRN [id number] PCP: Cyrus Selinda Moose, PA-C  [facility] Providers Cardiologist:  Caron Poser, MD     History of Present Illness Nathan Holland is a 68 y.o. male PMH prostate cancer who presents for further evaluation and management of palpitations.  Patient was seen in the [facility] ED on 06/04/2024.  He had a single episode of palpitations which lasted approximately 30 minutes.  He reportedly had been having frequent stools that day but denies diarrhea. The episode had resolved on arrival to the ED.  Labs and ECG were unremarkable.  He was felt to be low risk so was sent home without further intervention.  Last LDL 73 12/2023.  Today, he says that he has not had any episodes since.  He says that he cannot recall any episodes prior to that, but cannot be totally sure.  He says that before he went to the ED, vital signs taken at his workplace showed that he was borderline hypotensive.  He reports feeling bad during the episode.  He denies any known history of CVD.  Relevant CVD History -None   ROS: Pt denies any chest discomfort, jaw pain, arm pain, palpitations, syncope, presyncope, orthopnea, PND, or LE edema.  Studies Reviewed I have independently reviewed the patient's ECG, medical records from recent ED visit, and recent blood work.  Physical Exam VS:  BP (!) 144/60   Pulse 78   Ht 5' 11 (1.803 m)   Wt 251 lb 12.8 oz (114.2 kg)   SpO2 96%   BMI 35.12 kg/m        Wt Readings from Last 3 Encounters:  06/08/24 251 lb 12.8 oz (114.2 kg)  10/29/23 234 lb (106.1 kg)  07/30/23 233 lb 2 oz (105.7 kg)    GEN: No acute distress. NECK: No JVD; No carotid bruits. CARDIAC: RRR, no murmurs, rubs, gallops. RESPIRATORY:  Clear to auscultation. EXTREMITIES:  Warm and well-perfused. No edema.  ASSESSMENT AND PLAN Palpitations Dehydration Orthostasis Patient presents for further  evaluation following a single episode of palpitations which lasted approximately 30 minutes.  He was noted to be having frequent stools that day and reportedly had a low blood pressure and high heart rate checked at his workplace before going to the ED.  He never had any syncope.  He has not had recurrence of the episode and denies a recent history of other episodes.  Suspect his presentation is most likely orthostatic hypotension with compensatory tachycardia from dehydration, though we will evaluate for cardiac causes.  Plan: - Echocardiogram to rule out structural heart disease as cause - ZIO monitor to evaluate for arrhythmia - Counseled patient on importance of adequate hydration should this happen again; further plans pending results       Dispo: RTC as needed; patient would prefer for to be called with results rather than using MyChart  Signed, Caron Poser, MD

## 2024-06-08 NOTE — Patient Instructions (Signed)
 Medication Instructions:   No  changes in medications:  Your physician recommends that you continue on your current medications as directed. Please refer to the Current Medication list given to you today.  *If you need a refill on your cardiac medications before your next appointment, please call your pharmacy*  Lab Work:  No labs ordered today   If you have labs (blood work) drawn today and your tests are completely normal, you will receive your results only by: MyChart Message (if you have MyChart) OR A paper copy in the mail If you have any lab test that is abnormal or we need to change your treatment, we will call you to review the results.  Testing/Procedures:  Your physician has requested that you have an echocardiogram. Echocardiography is a painless test that uses sound waves to create images of your heart. It provides your doctor with information about the size and shape of your heart and how well your heart's chambers and valves are working.   You may receive an ultrasound enhancing agent through an IV if needed to better visualize your heart during the echo. This procedure takes approximately one hour.  There are no restrictions for this procedure.  This will take place at [address] [facility], [place] [address]  Please note: We ask at that you not bring children with you during ultrasound (echo/ vascular) testing. Due to room size and safety concerns, children are not allowed in the ultrasound rooms during exams. Our front office staff cannot provide observation of children in our lobby area while testing is being conducted. An adult accompanying a patient to their appointment will only be allowed in the ultrasound room at the discretion of the ultrasound technician under special circumstances. We apologize for any inconvenience.   Your physician has recommended that you wear a Zio monitor.   This monitor is a medical device that records the heart's  electrical activity. Doctors most often use these monitors to diagnose arrhythmias. Arrhythmias are problems with the speed or rhythm of the heartbeat. The monitor is a small device applied to your chest. You can wear one while you do your normal daily activities. While wearing this monitor if you have any symptoms to push the button and record what you felt. Once you have worn this monitor for the period of time provider prescribed (Usually 14 days), you will return the monitor device in the postage paid box. Once it is returned they will download the data collected and provide us  with a report which the provider will then review and we will call you with those results. Important tips:  Avoid showering during the first 24 hours of wearing the monitor. Avoid excessive sweating to help maximize wear time. Do not submerge the device, no hot tubs, and no swimming pools. Keep any lotions or oils away from the patch. After 24 hours you may shower with the patch on. Take brief showers with your back facing the shower head.  Do not remove patch once it has been placed because that will interrupt data and decrease adhesive wear time. Push the button when you have any symptoms and write down what you were feeling. Once you have completed wearing your monitor, remove and place into box which has postage paid and place in your outgoing mailbox.  If for some reason you have misplaced your box then call our office and we can provide another box and/or mail it off for you.   Follow-Up: At [facility], you and your health needs are our priority.  As part of our continuing mission to provide you with exceptional heart care, our providers are all part of one team.  This team includes your primary Cardiologist (physician) and Advanced Practice Providers or APPs (Physician Assistants and Nurse Practitioners) who all work together to provide you with the care you need, when you need it.  Your next appointment:   As needed   Provider:   You may see Caron Poser, MD   We recommend signing up for the patient portal called MyChart.  Sign up information is provided on this After Visit Summary.  MyChart is used to connect with patients for Virtual Visits (Telemedicine).  Patients are able to view lab/test results, encounter notes, upcoming appointments, etc.  Non-urgent messages can be sent to your provider as well.   To learn more about what you can do with MyChart, go to [email or web address].

## 2024-07-05 ENCOUNTER — Ambulatory Visit: Payer: Self-pay

## 2024-07-05 DIAGNOSIS — R002 Palpitations: Secondary | ICD-10-CM

## 2024-07-07 NOTE — Telephone Encounter (Signed)
 Called pt to inform them of the results of their Zio Heart Monitor and read them Dr. Martine message:  Monitor shows occasional, brief episodes of atrial tachycardia (fast rhythm coming from top chambers of heart). The rhythm is benign. If you are symptomatic from this, then we should start metoprolol XL 25mg  every day. If you are feeling okay and is without symptoms, then we can continue to watch and keep current plans.  Pt states he is not having symptoms and will continue with the current plan.  Will have an echocardiogram on 10/29.   The patient has been notified of the result along with recommendations. Pt verbalized understanding. All questions (if any) were answered

## 2024-07-21 ENCOUNTER — Other Ambulatory Visit: Payer: Self-pay

## 2024-07-21 DIAGNOSIS — N528 Other male erectile dysfunction: Secondary | ICD-10-CM

## 2024-07-21 MED ORDER — TADALAFIL 20 MG PO TABS: ORAL_TABLET | ORAL | 2 refills | Status: DC

## 2024-08-04 ENCOUNTER — Ambulatory Visit: Admitting: Urology

## 2024-08-04 ENCOUNTER — Ambulatory Visit

## 2024-08-04 VITALS — BP 130/75 | HR 82 | Ht 71.0 in | Wt 252.4 lb

## 2024-08-04 DIAGNOSIS — R399 Unspecified symptoms and signs involving the genitourinary system: Secondary | ICD-10-CM | POA: Diagnosis not present

## 2024-08-04 DIAGNOSIS — C61 Malignant neoplasm of prostate: Secondary | ICD-10-CM | POA: Diagnosis not present

## 2024-08-04 DIAGNOSIS — N529 Male erectile dysfunction, unspecified: Secondary | ICD-10-CM | POA: Diagnosis not present

## 2024-08-04 DIAGNOSIS — R002 Palpitations: Secondary | ICD-10-CM | POA: Diagnosis not present

## 2024-08-04 DIAGNOSIS — D494 Neoplasm of unspecified behavior of bladder: Secondary | ICD-10-CM | POA: Diagnosis not present

## 2024-08-04 LAB — ECHOCARDIOGRAM COMPLETE
AR max vel: 3.93 cm2
AV Area VTI: 3.82 cm2
AV Area mean vel: 3.67 cm2
AV Mean grad: 3 mmHg
AV Peak grad: 5.1 mmHg
Ao pk vel: 1.13 m/s
Area-P 1/2: 3.08 cm2
S' Lateral: 3.2 cm

## 2024-08-04 MED ORDER — TADALAFIL 5 MG PO TABS: 5.0000 mg | ORAL_TABLET | Freq: Every day | ORAL | 11 refills | Status: DC

## 2024-08-04 MED ORDER — LIDOCAINE HCL URETHRAL/MUCOSAL 2 % EX GEL
1.0000 | Freq: Once | CUTANEOUS | Status: AC
  Administered 2024-08-04: 1 via URETHRAL

## 2024-08-04 MED ORDER — TADALAFIL 5 MG PO TABS: 5.0000 mg | ORAL_TABLET | Freq: Every day | ORAL | 11 refills | Status: AC

## 2024-08-04 MED ORDER — SULFAMETHOXAZOLE-TRIMETHOPRIM 800-160 MG PO TABS
1.0000 | ORAL_TABLET | Freq: Once | ORAL | Status: AC
  Administered 2024-08-04: 1 via ORAL

## 2024-08-04 NOTE — Progress Notes (Signed)
 Cystoscopy Procedure Note:  Indication: History of bladder cancer  After informed consent and discussion of the procedure and its risks, Nathan Holland was positioned and prepped in the standard fashion. Cystoscopy was performed with a flexible cystoscope. The urethra, bladder neck and entire bladder was visualized in a standard fashion. The prostate was torturous but no suspicious lesions. The ureteral orifices were visualized in their normal location and orientation.  Bladder mucosa with mild erythema throughout but no definite papillary tumors.  Imaging: CT urogram August 2024 benign  Findings: Tortuous urethra but no evidence of recurrence, mild bladder erythema and cytology sent  ------------------------------------------------------------------  Assessment and Plan: 68 year old male with complex urologic history.  My initial visit with him October 2025 and extensive chart review performed.  Previously followed by Dr. Gala, reportedly treated for prostate cancer with brachytherapy in the past, PSA has remained very low.  Originally seen by [facility] urology April 2021 when he presented in retention requiring Foley catheter.  Also diagnosed with bladder tumor at that time and underwent proximal urethral biopsy, TURBT with Dr. Penne.  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  Was also treated with BCG, ultimately discontinued secondary to challenges with catheter placement.  Has had problems with cystoscopy/catheter placement for BCG secondary to torturous prostatic urethra.  Overall voiding well on alfuzosin and daily Cialis , has some mild urge incontinence that is not particularly bothersome.  With his history of retention would avoid OAB medications  No definite evidence of recurrence of bladder cancer today on cystoscopy, follow-up cytology and would recommend bladder biopsy if suspicious or positive  Daily Cialis  for ED  PSA has remained  undetectable after treatment for prostate cancer, most recently March 2025, continue yearly monitoring  RTC 6 months cystoscopy, call with cytology  I spent 45 total minutes on the day of the encounter outside of cystoscopy procedure including pre-visit review of the medical record, face-to-face time with the patient, and post visit ordering of labs/imaging/tests.  Extensive review of prior medical records, imaging, lab results.   Nathan Burnet, MD 08/04/2024

## 2024-08-05 ENCOUNTER — Other Ambulatory Visit: Admitting: Urology

## 2024-08-10 LAB — CYTOLOGY PLUS MONITORING PROFILE: PAP & FEULGEN

## 2024-08-11 ENCOUNTER — Ambulatory Visit: Payer: Self-pay | Admitting: Urology

## 2025-02-02 ENCOUNTER — Other Ambulatory Visit: Admitting: Urology
